# Patient Record
Sex: Female | Born: 1960
Health system: Southern US, Community
[De-identification: ages and names within clinical notes are randomized; demographics above are authoritative.]

## PROBLEM LIST (undated history)

## (undated) DIAGNOSIS — N6009 Solitary cyst of unspecified breast: Secondary | ICD-10-CM

## (undated) DIAGNOSIS — J45909 Unspecified asthma, uncomplicated: Secondary | ICD-10-CM

## (undated) DIAGNOSIS — B9681 Helicobacter pylori [H. pylori] as the cause of diseases classified elsewhere: Secondary | ICD-10-CM

## (undated) DIAGNOSIS — Z86010 Personal history of colonic polyps: Secondary | ICD-10-CM

## (undated) DIAGNOSIS — K5790 Diverticulosis of intestine, part unspecified, without perforation or abscess without bleeding: Secondary | ICD-10-CM

## (undated) DIAGNOSIS — K2970 Gastritis, unspecified, without bleeding: Secondary | ICD-10-CM

## (undated) DIAGNOSIS — Z832 Family history of diseases of the blood and blood-forming organs and certain disorders involving the immune mechanism: Secondary | ICD-10-CM

## (undated) DIAGNOSIS — D649 Anemia, unspecified: Secondary | ICD-10-CM

## (undated) DIAGNOSIS — T7840XA Allergy, unspecified, initial encounter: Secondary | ICD-10-CM

## (undated) DIAGNOSIS — E785 Hyperlipidemia, unspecified: Secondary | ICD-10-CM

## (undated) DIAGNOSIS — I252 Old myocardial infarction: Secondary | ICD-10-CM

## (undated) HISTORY — PX: COLONOSCOPY: SHX174

## (undated) HISTORY — DX: Allergy, unspecified, initial encounter: T78.40XA

## (undated) HISTORY — DX: Hyperlipidemia, unspecified: E78.5

## (undated) HISTORY — PX: WISDOM TOOTH EXTRACTION: SHX21

## (undated) HISTORY — DX: Solitary cyst of unspecified breast: N60.09

## (undated) HISTORY — DX: Diverticulosis of intestine, part unspecified, without perforation or abscess without bleeding: K57.90

## (undated) HISTORY — DX: Anemia, unspecified: D64.9

## (undated) HISTORY — DX: Personal history of colonic polyps: Z86.010

## (undated) HISTORY — DX: Family history of diseases of the blood and blood-forming organs and certain disorders involving the immune mechanism: Z83.2

## (undated) HISTORY — DX: Helicobacter pylori (H. pylori) as the cause of diseases classified elsewhere: B96.81

## (undated) HISTORY — DX: Gastritis, unspecified, without bleeding: K29.70

---

## 2002-09-27 DIAGNOSIS — I252 Old myocardial infarction: Secondary | ICD-10-CM

## 2002-09-27 HISTORY — DX: Old myocardial infarction: I25.2

## 2007-02-03 ENCOUNTER — Emergency Department (HOSPITAL_COMMUNITY): Admission: EM | Admit: 2007-02-03 | Discharge: 2007-02-03 | Payer: Self-pay | Admitting: Emergency Medicine

## 2009-09-27 HISTORY — PX: BACK SURGERY: SHX140

## 2012-02-17 ENCOUNTER — Ambulatory Visit: Payer: Self-pay | Admitting: Gynecology

## 2012-06-21 ENCOUNTER — Encounter (HOSPITAL_COMMUNITY): Payer: Self-pay

## 2012-06-21 ENCOUNTER — Emergency Department (HOSPITAL_COMMUNITY)
Admission: EM | Admit: 2012-06-21 | Discharge: 2012-06-21 | Disposition: A | Discharge status: Home / Self Care | Payer: Medicare Other | Attending: Emergency Medicine | Admitting: Emergency Medicine

## 2012-06-21 DIAGNOSIS — M25519 Pain in unspecified shoulder: Secondary | ICD-10-CM | POA: Diagnosis not present

## 2012-06-21 DIAGNOSIS — I252 Old myocardial infarction: Secondary | ICD-10-CM | POA: Diagnosis not present

## 2012-06-21 DIAGNOSIS — M25512 Pain in left shoulder: Secondary | ICD-10-CM

## 2012-06-21 DIAGNOSIS — Z7982 Long term (current) use of aspirin: Secondary | ICD-10-CM | POA: Diagnosis not present

## 2012-06-21 HISTORY — DX: Old myocardial infarction: I25.2

## 2012-06-21 LAB — TROPONIN I: Troponin I: <0.3 ng/mL (ref <0.30)

## 2012-06-21 MED ORDER — METHOCARBAMOL 100 MG/ML IJ SOLN
1000.0000 mg | Freq: Once | INTRAMUSCULAR | Status: AC
  Administered 2012-06-21: 1000 mg via INTRAMUSCULAR
  Filled 2012-06-21: qty 10

## 2012-06-21 MED ORDER — KETOROLAC TROMETHAMINE 30 MG/ML IJ SOLN
30.0000 mg | Freq: Once | INTRAMUSCULAR | Status: AC
  Administered 2012-06-21: 30 mg via INTRAMUSCULAR
  Filled 2012-06-21: qty 1

## 2012-06-21 MED ORDER — METHOCARBAMOL 500 MG PO TABS: 500.0000 mg | ORAL_TABLET | Freq: Two times a day (BID) | ORAL | Status: DC

## 2012-06-21 MED ORDER — TRAMADOL HCL 50 MG PO TABS: 50.0000 mg | ORAL_TABLET | Freq: Four times a day (QID) | ORAL | Status: DC | PRN

## 2012-06-21 NOTE — ED Provider Notes (Signed)
Medical screening examination/treatment/procedure(s) were performed by non-physician practitioner and as supervising physician I was immediately available for consultation/collaboration.  Tobin Chad, MD 06/21/12 1630

## 2012-06-21 NOTE — ED Notes (Signed)
Pt presents with left shoulder and neck pain that radiates down to left hand. Pain level 10 feel syncopal. Pt  Had back surgery 2012 on L5 disc.from work injury in 2010. Neck pain was intermittent but has recently become unbearable. Pt does not know how it became aggravated.

## 2012-06-21 NOTE — Progress Notes (Signed)
Prior to d/c [facility] cm spoke with pt confirmed no pcp and [employer] coverage CM reviewed with pt how to obtain in network [employer] providers within her zip code via toll free number or web site to prevent extra fees

## 2012-06-21 NOTE — ED Provider Notes (Signed)
History     CSN: [id number]  Arrival date & time 06/21/12  1048   First MD Initiated Contact with Patient 06/21/12 1115      Chief Complaint  Patient presents with  . Shoulder Pain    (Consider location/radiation/quality/duration/timing/severity/associated sxs/prior treatment) HPI Comments: Courtney Larsen 51 y.o. female   The chief complaint is: Patient presents with:   Shoulder Pain   The patient has medical history significant for:   Past Medical History:   MI, old                                         2004        Patient presents with left shoulder pain X 2 days with radiation and transmission to left hand. She rates the pain 10/10 and states that the arm often "locks up" on her. She attributes the pain chronic neck pain due to disc compression of C5-C7 and mild cervical stenosis per MRI done in Jan 2012. Patient would like a repeat MRI during today's fast track visit. Patient has taken bayer aspirin and vinegar (a blood thinner according to old country medicine) because when the pain started, she thought she might be having a stroke or MI. Of note the patient states that she was told she had an MI in 2005 per an EKG but never had a cath or cardiac follow-up. Denies fever or chills. Denies NVD or abdominal pain. Denies CP, SOB, or palpitations.       The history is provided by the patient. No language interpreter was used.    Past Medical History  Diagnosis Date  . MI, old 2004    No past surgical history on file.  No family history on file.  History  Substance Use Topics  . Smoking status: Not on file  . Smokeless tobacco: Not on file  . Alcohol Use:     OB History    Grav Para Term Preterm Abortions TAB SAB Ect Mult Living                  Review of Systems  Constitutional: Negative for fever and chills.  Respiratory: Negative for shortness of breath.   Cardiovascular: Negative for chest pain and palpitations.  Gastrointestinal: Negative for  nausea, vomiting, abdominal pain and diarrhea.  Musculoskeletal: Positive for arthralgias.  All other systems reviewed and are negative.    Allergies  Review of patient's allergies indicates no known allergies.  Home Medications   Current Outpatient Rx  Name Route Sig Dispense Refill  . ASPIRIN 325 MG PO TABS Oral Take 650 mg by mouth as needed. For pain    . NAPROXEN SODIUM 220 MG PO TABS Oral Take 440 mg by mouth 2 (two) times daily as needed. For pain      BP 126/56  Pulse 87  Temp 98.7 F (37.1 C) (Oral)  Resp 18  SpO2 98%  Physical Exam  Nursing note and vitals reviewed. Constitutional: She appears well-developed and well-nourished. She appears distressed.  HENT:  Head: Normocephalic and atraumatic.  Mouth/Throat: Oropharynx is clear and moist.  Eyes: Conjunctivae normal and EOM are normal. No scleral icterus.  Neck: Normal range of motion. Neck supple.  Cardiovascular: Normal rate, regular rhythm and normal heart sounds.   Pulmonary/Chest: Effort normal and breath sounds normal.  Abdominal: Soft. Bowel sounds are normal. There is no tenderness.  Musculoskeletal: Normal  range of motion. She exhibits tenderness. She exhibits no edema.       Patient has tenderness to palpation of the upper back and left shoulder. She has pain with abduction, internal, and external rotation  Radial pulse strong with good cap refill.  Neurological: She is alert.  Skin: Skin is warm and dry.    ED Course  Procedures (including critical care time)   Labs Reviewed  TROPONIN I   Results for orders placed during the hospital encounter of 06/21/12  TROPONIN I      Component Value Range   Troponin I <0.30  <0.30 ng/mL   Date: 06/21/2012  Rate: 77  Rhythm: normal sinus rhythm  QRS Axis: normal  Intervals: normal  ST/T Wave abnormalities: normal  Conduction Disutrbances:none  Narrative Interpretation: No STEMI  Old EKG Reviewed: none available    No results found.   1.  Left shoulder pain       MDM  Patient presented with left shoulder pain X 2 days that she attributes to degenerative changes in her cervical spine. Patient wanted an MRI of her cervical spine to compare to the one from Jan 2012. Patient was upset and argumentative when I informed her that per Dr. Lorenso Courier and my opinion that the MRI should be done outpatient. Given robaxin and Toradol  IM as she declined narcotics or medication in pill form, with mild improvement. Patient discharged on ultram and robaxin with referral to ortho/spine with return precautions. Given patient's self reported MI per old EKG, troponin and EKG ordered, both unremarkable. No red flags for AMI, CVA, or palsy.        Pixie Casino, PA-C 06/21/12 1520

## 2012-06-26 ENCOUNTER — Other Ambulatory Visit (HOSPITAL_COMMUNITY)
Admission: RE | Admit: 2012-06-26 | Discharge: 2012-06-26 | Disposition: A | Discharge status: Home / Self Care | Payer: Medicare Other | Source: Ambulatory Visit | Attending: Gynecology | Admitting: Gynecology

## 2012-06-26 ENCOUNTER — Encounter: Payer: Self-pay | Admitting: Gynecology

## 2012-06-26 ENCOUNTER — Ambulatory Visit (INDEPENDENT_AMBULATORY_CARE_PROVIDER_SITE_OTHER): Payer: Medicare Other | Admitting: Gynecology

## 2012-06-26 VITALS — BP 122/80 | Ht 69.25 in | Wt 185.0 lb

## 2012-06-26 DIAGNOSIS — N644 Mastodynia: Secondary | ICD-10-CM | POA: Insufficient documentation

## 2012-06-26 DIAGNOSIS — Z01419 Encounter for gynecological examination (general) (routine) without abnormal findings: Secondary | ICD-10-CM | POA: Diagnosis not present

## 2012-06-26 DIAGNOSIS — Z832 Family history of diseases of the blood and blood-forming organs and certain disorders involving the immune mechanism: Secondary | ICD-10-CM

## 2012-06-26 DIAGNOSIS — R635 Abnormal weight gain: Secondary | ICD-10-CM

## 2012-06-26 DIAGNOSIS — Z9189 Other specified personal risk factors, not elsewhere classified: Secondary | ICD-10-CM

## 2012-06-26 DIAGNOSIS — N9489 Other specified conditions associated with female genital organs and menstrual cycle: Secondary | ICD-10-CM | POA: Diagnosis not present

## 2012-06-26 DIAGNOSIS — M542 Cervicalgia: Secondary | ICD-10-CM | POA: Diagnosis not present

## 2012-06-26 DIAGNOSIS — N898 Other specified noninflammatory disorders of vagina: Secondary | ICD-10-CM

## 2012-06-26 DIAGNOSIS — N840 Polyp of corpus uteri: Secondary | ICD-10-CM | POA: Insufficient documentation

## 2012-06-26 DIAGNOSIS — N83201 Unspecified ovarian cyst, right side: Secondary | ICD-10-CM | POA: Insufficient documentation

## 2012-06-26 DIAGNOSIS — Z833 Family history of diabetes mellitus: Secondary | ICD-10-CM | POA: Insufficient documentation

## 2012-06-26 DIAGNOSIS — E782 Mixed hyperlipidemia: Secondary | ICD-10-CM | POA: Diagnosis not present

## 2012-06-26 DIAGNOSIS — Z803 Family history of malignant neoplasm of breast: Secondary | ICD-10-CM

## 2012-06-26 DIAGNOSIS — N6009 Solitary cyst of unspecified breast: Secondary | ICD-10-CM | POA: Insufficient documentation

## 2012-06-26 DIAGNOSIS — Z23 Encounter for immunization: Secondary | ICD-10-CM | POA: Diagnosis not present

## 2012-06-26 DIAGNOSIS — M25519 Pain in unspecified shoulder: Secondary | ICD-10-CM | POA: Diagnosis not present

## 2012-06-26 DIAGNOSIS — Z1151 Encounter for screening for human papillomavirus (HPV): Secondary | ICD-10-CM | POA: Insufficient documentation

## 2012-06-26 HISTORY — DX: Solitary cyst of unspecified breast: N60.09

## 2012-06-26 LAB — CBC WITH DIFFERENTIAL/PLATELET
Basophils Absolute: 0 10*3/uL (ref 0.0–0.1)
Basophils Relative: 1 % (ref 0–1)
Eosinophils Absolute: 0.5 10*3/uL (ref 0.0–0.7)
Eosinophils Relative: 12 % — ABNORMAL HIGH (ref 0–5)
HCT: 38.1 % (ref 36.0–46.0)
Hemoglobin: 12.4 g/dL (ref 12.0–15.0)
Lymphocytes Relative: 37 % (ref 12–46)
Lymphs Abs: 1.6 10*3/uL (ref 0.7–4.0)
MCH: 28.7 pg (ref 26.0–34.0)
MCHC: 32.5 g/dL (ref 30.0–36.0)
MCV: 88.2 fL (ref 78.0–100.0)
Monocytes Absolute: 0.3 10*3/uL (ref 0.1–1.0)
Monocytes Relative: 8 % (ref 3–12)
Neutro Abs: 1.8 10*3/uL (ref 1.7–7.7)
Neutrophils Relative %: 42 % — ABNORMAL LOW (ref 43–77)
Platelets: 251 10*3/uL (ref 150–400)
RBC: 4.32 MIL/uL (ref 3.87–5.11)
RDW: 13.9 % (ref 11.5–15.5)
WBC: 4.3 10*3/uL (ref 4.0–10.5)

## 2012-06-26 NOTE — Progress Notes (Addendum)
Courtney Larsen 1961-01-19 [id number]   History:    51 y.o.  for annual gyn exam gravida 3 para 3 new patient to the practice who moved into the area from [place] a few months ago. Patient with a past medical history as follows: #1 Patient with breast cancer history in her sister in her late 25s. Patient herself had a left breast cyst in 2011 that was followed with ultrasound and recommended followup but she moved out of town. He was described as being located on the left breast at the 3:00 position 7 cm from the areolar region. Patient previously screened for the BRCA one BRCA2 gene mutation and not detected. #2 Patient with history of right ovarian cyst in March of 2012 an incidental finding on ultrasound demonstrated an endometrial polyp. #3 family history of von Willebrand's disease (sister, mother and patient)  Patient has been menopausal for the past 3 years and has never been on hormone replacement therapy and denies any symptoms or any vaginal bleeding. Patient denies any past history of abnormal Paps. Patient is currently sexually active and does not recall when she's had a dTap Vaccine. Patient with no prior colonoscopy. Mother with history of diabetes.  Past medical history,surgical history, family history and social history were all reviewed and documented in the EPIC chart.  Gynecologic History Patient's last menstrual period was 06/26/2010. Contraception: none and Postmenopausal Last Pap: 2011. Results were: normal Last mammogram: 2011. Results were: See above  Obstetric History OB History    Grav Para Term Preterm Abortions TAB SAB Ect Mult Living   3 3 2 1      3      # Outc Date GA Lbr Len/2nd Wgt Sex Del Anes PTL Lv   1 PRE     M CS  Yes Yes   2 TRM     M SVD  No Yes   3 TRM     M VBAC, Forcep  No Yes       ROS: A ROS was performed and pertinent positives and negatives are included in the history.  GENERAL: No fevers or chills. HEENT: No change in vision, no earache,  sore throat or sinus congestion. NECK: No pain or stiffness. CARDIOVASCULAR: No chest pain or pressure. No palpitations. PULMONARY: No shortness of breath, cough or wheeze. GASTROINTESTINAL: No abdominal pain, nausea, vomiting or diarrhea, melena or bright red blood per rectum. GENITOURINARY: No urinary frequency, urgency, hesitancy or dysuria. MUSCULOSKELETAL: No joint or muscle pain, no back pain, no recent trauma. DERMATOLOGIC: No rash, no itching, no lesions. ENDOCRINE: No polyuria, polydipsia, no heat or cold intolerance. No recent change in weight. HEMATOLOGICAL: No anemia or easy bruising or bleeding. NEUROLOGIC: No headache, seizures, numbness, tingling or weakness. PSYCHIATRIC: No depression, no loss of interest in normal activity or change in sleep pattern.     Exam: chaperone present  BP 122/80  Ht 5' 9.25" (1.759 m)  Wt 185 lb (83.915 kg)  BMI 27.12 kg/m2  LMP 06/26/2010  Body mass index is 27.12 kg/(m^2).  General appearance : Well developed well nourished female. No acute distress HEENT: Neck supple, trachea midline, no carotid bruits, no thyroidmegaly Lungs: Clear to auscultation, no rhonchi or wheezes, or rib retractions  Heart: Regular rate and rhythm, no murmurs or gallops Breast:Examined in sitting and supine position were symmetrical in appearance, no palpable masses some tenderness was noted in the  outer quadrant of both breasts, no skin retraction, no nipple inversion, no nipple discharge, no skin  discoloration, no axillary or supraclavicular lymphadenopathy Abdomen: no palpable masses or tenderness, no rebound or guarding Extremities: no edema or skin discoloration or tenderness  Pelvic:  Bartholin, Urethra, Skene Glands: Within normal limits             Vagina: No gross lesions or discharge  Cervix: No gross lesions or discharge  Uterus  anteverted, normal size, shape and consistency, non-tender and mobile  Adnexa  Without masses or tenderness  Anus and perineum   normal   Rectovaginal  normal sphincter tone without palpated masses or tenderness             Hemoccult cards will be provided her to submit to the office for testing     Assessment/Plan:  51 y.o. female for annual exam with past history of left breast cyst in the [place]. No prior breast biopsy. The left breast cyst was described on the radiology report at the left breast 3:00 position 7 cm from the areolar region. Patient does have bilateral mastodynia. She will be referred for a diagnostic mammogram of the left breast and screening mammogram on the right. New Pap smear screening guidelines discussed. Since patient has no past history of abnormal Pap smears we'll start next year with a new guidelines. Since we do not have any reports from prior years and since she moved from out of town we'll do a Pap smear today. She was given the names and numbers of gastroenterologist in our community for her to schedule colonoscopy. She was instructed to submit to the office Hemoccult cards for testing. She will return back to the office next week for sonohysterogram to better assess her intrauterine cavity because of reported history of endometrial polyp in the past although she has had no vaginal bleeding. We'll also followup on the right ovarian cyst that was described on ultrasound report from March of 2012. We discussed using luvena probiotic vaginal gel as a lubricant when necessary. The following labs will be ordered today: Fasting lipid profile, fasting blood sugar, TSH, CBC, urinalysis and Pap smear. Patient was to receive the dTap Vaccine today. Patient was instructed to cut down on ice tea and sodas to help cut down her mastodynia.  Ok Edwards MD, 2:07 PM 06/26/2012

## 2012-06-26 NOTE — Patient Instructions (Addendum)
Diphtheria, Tetanus, and Pertussis (DTaP) Vaccine What You Need to Know WHY GET VACCINATED? Diphtheria, tetanus, and pertussis are serious diseases caused by bacteria. Diphtheria and pertussis are spread from person to person. Tetanus enters the body through cuts or wounds. Diphtheria causes a thick covering in the back of the throat.  It can lead to breathing problems, paralysis, heart failure, and even death.  Tetanus (Lockjaw) causes painful tightening of the muscles, usually all over the body.  It can lead to "locking" of the jaw so the victim cannot open his or her mouth or swallow. Tetanus leads to death in about 2 out of 10 cases.  Pertussis (Whooping Cough) causes coughing spells so bad that it is hard for infants to eat, drink, or breathe. These spells can last for weeks.  It can lead to pneumonia, seizures (jerking and staring spells), brain damage, and death.  Diphtheria, tetanus, and pertussis vaccine (DTaP) can help prevent these diseases. Most children who are vaccinated with DTaP will be protected throughout childhood. Many more children would get these diseases if we stopped vaccinating. DTaP is a safer version of an older vaccine called DTP. DTP is no longer used in the [place]. WHO SHOULD GET DTAP VACCINE AND WHEN? Children should get 5 doses of DTaP vaccine, 1 dose at each of the following ages:  2 months.   4 months.   6 months.   15 to 18 months.   4 to 6 years.  DTaP may be given at the same time as other vaccines. SOME CHILDREN SHOULD NOT GET DTAP VACCINE OR SHOULD WAIT  Children with minor illnesses, such as a cold, may be vaccinated. But children who are moderately or severely ill should usually wait until they recover before getting DTaP vaccine.   Any child who had a life-threatening allergic reaction after a dose of DTaP should not get another dose.   Any child who suffered a brain or nervous system disease within 7 days after a dose of DTaP should  not get another dose.   Talk with your caregiver if your child:   Had a seizure or collapsed after a dose of DTaP.   Cried non-stop for 3 hours or more after a dose of DTaP.   Had a fever over 105 F (40.6 C) after a dose of DTaP.   Ask your caregiver for more information. Some of these children should not get another dose of pertussis vaccine, but may get a vaccine without pertussis, called DT.  OLDER CHILDREN AND ADULTS  DTaP is not licensed for adolescents, adults, or children 1 years of age and older.   But older people still need protection. A vaccine called Tdap is similar to DTaP. A single dose of Tdap is recommended for people 11 through 53 years of age. Another vaccine, called Td, protects against tetanus and diphtheria, but not pertussis. It is recommended every 10 years.  WHAT ARE THE RISKS FROM DTAP VACCINE?  Getting diphtheria, tetanus, or pertussis disease is much riskier than getting DTaP vaccine.   However, a vaccine, like any medicine, is capable of causing serious problems, such as severe allergic reactions. The risk of DTaP vaccine causing serious harm, or death, is extremely small.  Mild Problems (Common)  Fever (up to about 1 child in 4).   Redness or swelling where the shot was given (up to about 1 child in 4).   Soreness or tenderness where the shot was given (up to about 1 child in 4).  These problems occur more often after the 4th and 5th doses of the DTaP series than after earlier doses. Sometimes the 4th or 5th dose of DTaP vaccine is followed by swelling of the entire arm or leg in which the shot was given, lasting 1 to 7 days (up to about 1 child in 30). Other mild problems include:  Fussiness (up to about 1 child in 3).   Tiredness or poor appetite (up to about 1 child in 10).   Vomiting (up to about 1 child in 50).  These problems generally occur 1 to 3 days after the shot. Moderate Problems (Uncommon)  Seizure (jerking or staring) (about 1 child  out of 14,000).   Non-stop crying, for 3 hours or more (up to about 1 child out of 1,000).   High fever, over 105 F (40.6 C) (about 1 child out of 16,000).  Severe Problems (Very Rare)  Serious allergic reaction (less than 1 out of a million doses).   Several other severe problems have been reported after DTaP vaccine. These include:   Long-term seizures, coma, or lowered consciousness.   Permanent brain damage.  These are so rare it is hard to tell if they are caused by the vaccine. Controlling fever is especially important for children who have had seizures, for any reason. It is also important if another family member has had seizures. You can reduce fever and pain by giving your child an aspirin-free pain reliever when the shot is given, and for the next 24 hours, following the package instructions. WHAT IF THERE IS A MODERATE OR SEVERE REACTION? What should I look for? Any unusual conditions, such as a serious allergic reaction, high fever, or unusual behavior. Serious allergic reactions are extremely rare with any vaccine. If one were to occur, it would most likely be within a few minutes to a few hours after the shot. Signs can include difficulty breathing, hoarseness or wheezing, hives, paleness, weakness, a fast heartbeat, or dizziness. If a high fever or seizure were to occur, it would usually be within a week after the shot. What should I do?  Call your caregiver or get the person to a caregiver right away.   Tell the caregiver what happened, the date and time it happened, and when the vaccination was given.   Ask the caregiver, nurse, or health department to file a Vaccine Adverse Event Reporting System (VAERS) form. Or, you can file this report through the VAERS website at www.vaers.LAgents.no or by calling 1-(563)628-9859.  VAERS does not provide medical advice. THE NATIONAL VACCINE INJURY COMPENSATION PROGRAM  In the rare event that you or your child has a serious reaction  to a vaccine, a federal program has been created to help you pay for the care of those who have been harmed.   For details about the National Vaccine Injury Compensation Program, call 315-830-0253 or visit the program's website at SpiritualWord.at  HOW CAN I LEARN MORE?  Ask your caregiver. They can give you the vaccine package insert or suggest other sources of information.   Call your local or state health department's immunization program.   Contact the Centers for Disease Control and Prevention (CDC):   Call 778-515-9892 (1-800-CDC-INFO).   Visit the [employer] at PicCapture.uy  CDC Diphtheria, Tetanus, and Pertussis (DTaP) Vaccine VIS (02/10/06) Document Released: 07/11/2006 Document Revised: 09/02/2011 Document Reviewed: 07/11/2006 [facility] Patient Information 2012 [place], [place].

## 2012-06-27 LAB — GLUCOSE, RANDOM: Glucose, Bld: 79 mg/dL (ref 70–99)

## 2012-06-27 LAB — URINALYSIS W MICROSCOPIC + REFLEX CULTURE
Bacteria, UA: NONE SEEN
Bilirubin Urine: NEGATIVE
Casts: NONE SEEN
Crystals: NONE SEEN
Glucose, UA: NEGATIVE mg/dL
Hgb urine dipstick: NEGATIVE
Ketones, ur: NEGATIVE mg/dL
Leukocytes, UA: NEGATIVE
Nitrite: NEGATIVE
Protein, ur: NEGATIVE mg/dL
Specific Gravity, Urine: 1.024 (ref 1.005–1.030)
Urobilinogen, UA: 0.2 mg/dL (ref 0.0–1.0)
pH: 7.5 (ref 5.0–8.0)

## 2012-06-27 LAB — LIPID PANEL
Cholesterol: 227 mg/dL — ABNORMAL HIGH (ref 0–200)
HDL: 57 mg/dL (ref >39)
LDL Cholesterol: 157 mg/dL — ABNORMAL HIGH (ref 0–99)
Total CHOL/HDL Ratio: 4 Ratio
Triglycerides: 67 mg/dL (ref <150)
VLDL: 13 mg/dL (ref 0–40)

## 2012-06-27 LAB — TSH: TSH: 1.045 u[IU]/mL (ref 0.350–4.500)

## 2012-06-28 ENCOUNTER — Other Ambulatory Visit: Payer: Self-pay | Admitting: Orthopedic Surgery

## 2012-06-28 DIAGNOSIS — M25512 Pain in left shoulder: Secondary | ICD-10-CM

## 2012-06-28 DIAGNOSIS — M542 Cervicalgia: Secondary | ICD-10-CM

## 2012-06-30 ENCOUNTER — Other Ambulatory Visit: Payer: Self-pay | Admitting: Gynecology

## 2012-06-30 DIAGNOSIS — E7800 Pure hypercholesterolemia, unspecified: Secondary | ICD-10-CM

## 2012-07-02 ENCOUNTER — Ambulatory Visit
Admission: RE | Admit: 2012-07-02 | Discharge: 2012-07-02 | Disposition: A | Discharge status: Home / Self Care | Payer: Medicare Other | Source: Ambulatory Visit | Attending: Orthopedic Surgery | Admitting: Orthopedic Surgery

## 2012-07-02 DIAGNOSIS — M67919 Unspecified disorder of synovium and tendon, unspecified shoulder: Secondary | ICD-10-CM | POA: Diagnosis not present

## 2012-07-02 DIAGNOSIS — M25512 Pain in left shoulder: Secondary | ICD-10-CM

## 2012-07-02 DIAGNOSIS — M542 Cervicalgia: Secondary | ICD-10-CM

## 2012-07-02 DIAGNOSIS — M47812 Spondylosis without myelopathy or radiculopathy, cervical region: Secondary | ICD-10-CM | POA: Diagnosis not present

## 2012-07-02 DIAGNOSIS — M25519 Pain in unspecified shoulder: Secondary | ICD-10-CM | POA: Diagnosis not present

## 2012-07-03 ENCOUNTER — Other Ambulatory Visit: Payer: Self-pay | Admitting: Gynecology

## 2012-07-03 ENCOUNTER — Telehealth: Payer: Self-pay | Admitting: *Deleted

## 2012-07-03 ENCOUNTER — Ambulatory Visit (INDEPENDENT_AMBULATORY_CARE_PROVIDER_SITE_OTHER): Payer: Medicare Other | Admitting: Gynecology

## 2012-07-03 ENCOUNTER — Ambulatory Visit (INDEPENDENT_AMBULATORY_CARE_PROVIDER_SITE_OTHER): Payer: Medicare Other

## 2012-07-03 DIAGNOSIS — N840 Polyp of corpus uteri: Secondary | ICD-10-CM

## 2012-07-03 DIAGNOSIS — N83201 Unspecified ovarian cyst, right side: Secondary | ICD-10-CM

## 2012-07-03 DIAGNOSIS — N83209 Unspecified ovarian cyst, unspecified side: Secondary | ICD-10-CM | POA: Diagnosis not present

## 2012-07-03 DIAGNOSIS — Z8742 Personal history of other diseases of the female genital tract: Secondary | ICD-10-CM

## 2012-07-03 DIAGNOSIS — N949 Unspecified condition associated with female genital organs and menstrual cycle: Secondary | ICD-10-CM

## 2012-07-03 DIAGNOSIS — Z803 Family history of malignant neoplasm of breast: Secondary | ICD-10-CM

## 2012-07-03 MED ORDER — LIDOCAINE HCL 1 % IJ SOLN
5.0000 mL | Freq: Once | INTRAMUSCULAR | Status: AC
  Administered 2012-07-03: 5 mL

## 2012-07-03 NOTE — Progress Notes (Signed)
51 y.o. who was seen in the office on October 1 for annual gyn exam gravida 3 para 3 new patient to the practice who moved into the area from [place] a few months ago. Patient with a past medical history as follows:   Patient with breast cancer history in her sister in her late 53s. Patient herself had a left breast cyst in 2011 that was followed with ultrasound and recommended followup but she moved out of town. He was described as being located on the left breast at the 3:00 position 7 cm from the areolar region. Patient previously screened for the BRCA one BRCA2 gene mutation and not detected.   Patient with history of right ovarian cyst in March of 2012 an incidental finding on ultrasound demonstrated an endometrial polyp.   family history of von Willebrand's disease (sister, mother and patient)  Patient has been menopausal for the past 3 years and has never been on hormone replacement therapy and denies any symptoms or any vaginal bleeding.  Patient denies any past history of abnormal Paps.  Patient denies any vaginal bleeding and presented to the office for followup ultrasound. Ultrasound report as follows: Uterus measures 7.5 x 4.5 x 3.7 cm with endometrial stripe of 3.8 mm. Right and left ovary otherwise normal. Previously reported ovarian cyst not seen on the right. Sonohysterogram no intracavitary defect and left ovary was normal.  Patient will followup as instructed with her gastroenterologist for screening colonoscopy. She will be referred to the radiology service for diagnostic mammogram of the left breast due to the report in [place] stating that at the left breast 3:00 position 7 cm from the areolar region there was a breast cyst. Patient's recent labs indicated her total cholesterol was slightly elevated at 227 and LDL elevated at 157. The remainder of her lipid profile was otherwise normal. She will be placed on a low-fat diet and encouraged to exercise 3-4 times a week and we  can repeat her lipid profile in 6 months. Her CBC, TSH, urinalysis and Pap smear were normal.

## 2012-07-03 NOTE — Telephone Encounter (Signed)
Order placed

## 2012-07-03 NOTE — Telephone Encounter (Signed)
Message copied by Aura Camps on Mon Jul 03, 2012  2:04 PM ------      Message from: Ok Edwards      Created: Mon Jul 03, 2012 12:59 PM       [name], this patient needs a diagnostic mammogram of left breast and screening mammogram of the right. Patient expecting call to schedule appointment.            Patient with breast cancer history in her sister in her late 57s. Patient herself had a left breast cyst in 2011 that was followed with ultrasound and recommended followup but she moved out of town. He was described as being located on the left breast at the 3:00 position 7 cm from the areolar region. Patient previously screened for the BRCA one BRCA2 gene mutation and not detected.            Thank you

## 2012-07-04 DIAGNOSIS — M542 Cervicalgia: Secondary | ICD-10-CM | POA: Diagnosis not present

## 2012-07-06 ENCOUNTER — Encounter (HOSPITAL_COMMUNITY): Payer: Self-pay | Admitting: Anesthesiology

## 2012-07-06 ENCOUNTER — Inpatient Hospital Stay (HOSPITAL_COMMUNITY): Payer: Medicare Other | Admitting: Anesthesiology

## 2012-07-06 ENCOUNTER — Other Ambulatory Visit: Payer: Self-pay | Admitting: Neurosurgery

## 2012-07-06 ENCOUNTER — Inpatient Hospital Stay (HOSPITAL_COMMUNITY)
Admission: AD | Admit: 2012-07-06 | Discharge: 2012-07-07 | DRG: 473 | Disposition: A | Discharge status: Home / Self Care | Payer: Medicare Other | Source: Ambulatory Visit | Attending: Neurosurgery | Admitting: Neurosurgery

## 2012-07-06 ENCOUNTER — Encounter (HOSPITAL_COMMUNITY)
Admission: AD | Disposition: A | Discharge status: Home / Self Care | Payer: Self-pay | Source: Ambulatory Visit | Attending: Neurosurgery

## 2012-07-06 ENCOUNTER — Encounter (HOSPITAL_COMMUNITY): Payer: Self-pay | Admitting: *Deleted

## 2012-07-06 ENCOUNTER — Inpatient Hospital Stay (HOSPITAL_COMMUNITY): Payer: Medicare Other

## 2012-07-06 DIAGNOSIS — Z23 Encounter for immunization: Secondary | ICD-10-CM | POA: Diagnosis not present

## 2012-07-06 DIAGNOSIS — M5090 Cervical disc disorder, unspecified, unspecified cervical region: Secondary | ICD-10-CM | POA: Diagnosis not present

## 2012-07-06 DIAGNOSIS — M1990 Unspecified osteoarthritis, unspecified site: Secondary | ICD-10-CM | POA: Diagnosis present

## 2012-07-06 DIAGNOSIS — I252 Old myocardial infarction: Secondary | ICD-10-CM

## 2012-07-06 DIAGNOSIS — M5020 Other cervical disc displacement, unspecified cervical region: Secondary | ICD-10-CM | POA: Diagnosis not present

## 2012-07-06 DIAGNOSIS — M5412 Radiculopathy, cervical region: Secondary | ICD-10-CM | POA: Diagnosis not present

## 2012-07-06 DIAGNOSIS — R29898 Other symptoms and signs involving the musculoskeletal system: Secondary | ICD-10-CM | POA: Diagnosis not present

## 2012-07-06 HISTORY — PX: ANTERIOR CERVICAL DECOMP/DISCECTOMY FUSION: SHX1161

## 2012-07-06 HISTORY — DX: Unspecified asthma, uncomplicated: J45.909

## 2012-07-06 LAB — BASIC METABOLIC PANEL WITH GFR
BUN: 11 mg/dL (ref 6–23)
CO2: 25 meq/L (ref 19–32)
Calcium: 9.5 mg/dL (ref 8.4–10.5)
Chloride: 102 meq/L (ref 96–112)
Creatinine, Ser: 0.62 mg/dL (ref 0.50–1.10)
GFR calc Af Amer: >90 mL/min
GFR calc non Af Amer: >90 mL/min
Glucose, Bld: 91 mg/dL (ref 70–99)
Potassium: 4.7 meq/L (ref 3.5–5.1)
Sodium: 139 meq/L (ref 135–145)

## 2012-07-06 LAB — CBC
HCT: 37 % (ref 36.0–46.0)
Hemoglobin: 12.2 g/dL (ref 12.0–15.0)
MCH: 29.2 pg (ref 26.0–34.0)
MCHC: 33 g/dL (ref 30.0–36.0)
MCV: 88.5 fL (ref 78.0–100.0)
Platelets: 224 10*3/uL (ref 150–400)
RBC: 4.18 MIL/uL (ref 3.87–5.11)
RDW: 14 % (ref 11.5–15.5)
WBC: 5.1 10*3/uL (ref 4.0–10.5)

## 2012-07-06 LAB — SURGICAL PCR SCREEN
MRSA, PCR: NEGATIVE
Staphylococcus aureus: NEGATIVE

## 2012-07-06 SURGERY — ANTERIOR CERVICAL DECOMPRESSION/DISCECTOMY FUSION 1 LEVEL
Anesthesia: General | Site: Spine Cervical | Wound class: Clean

## 2012-07-06 MED ORDER — CEFAZOLIN SODIUM-DEXTROSE 2-3 GM-% IV SOLR
2.0000 g | INTRAVENOUS | Status: AC
  Administered 2012-07-06: 2 g via INTRAVENOUS

## 2012-07-06 MED ORDER — FENTANYL CITRATE 0.05 MG/ML IJ SOLN
INTRAMUSCULAR | Status: DC | PRN
  Administered 2012-07-06: 150 ug via INTRAVENOUS
  Administered 2012-07-06: 100 ug via INTRAVENOUS

## 2012-07-06 MED ORDER — MENTHOL 3 MG MT LOZG: 1.0000 | LOZENGE | OROMUCOSAL | Status: DC | PRN

## 2012-07-06 MED ORDER — SODIUM CHLORIDE 0.9 % IV SOLN: 250.0000 mL | INTRAVENOUS | Status: DC

## 2012-07-06 MED ORDER — DEXAMETHASONE SODIUM PHOSPHATE 4 MG/ML IJ SOLN
INTRAMUSCULAR | Status: DC | PRN
  Administered 2012-07-06: 8 mg via INTRAVENOUS

## 2012-07-06 MED ORDER — PHENOL 1.4 % MT LIQD: 1.0000 | OROMUCOSAL | Status: DC | PRN

## 2012-07-06 MED ORDER — OXYCODONE HCL 5 MG PO TABS: 5.0000 mg | ORAL_TABLET | Freq: Once | ORAL | Status: DC | PRN

## 2012-07-06 MED ORDER — CEFAZOLIN SODIUM-DEXTROSE 2-3 GM-% IV SOLR
INTRAVENOUS | Status: AC
  Filled 2012-07-06: qty 50

## 2012-07-06 MED ORDER — SODIUM CHLORIDE 0.9 % IJ SOLN: 3.0000 mL | INTRAMUSCULAR | Status: DC | PRN

## 2012-07-06 MED ORDER — 0.9 % SODIUM CHLORIDE (POUR BTL) OPTIME
TOPICAL | Status: DC | PRN
  Administered 2012-07-06: 1000 mL

## 2012-07-06 MED ORDER — HYDROMORPHONE HCL PF 1 MG/ML IJ SOLN
INTRAMUSCULAR | Status: AC
  Filled 2012-07-06: qty 1

## 2012-07-06 MED ORDER — HYDROMORPHONE HCL PF 1 MG/ML IJ SOLN
0.2500 mg | INTRAMUSCULAR | Status: DC | PRN
  Administered 2012-07-06: 0.5 mg via INTRAVENOUS

## 2012-07-06 MED ORDER — HYDROCODONE-ACETAMINOPHEN 5-325 MG PO TABS: 1.0000 | ORAL_TABLET | ORAL | Status: DC | PRN

## 2012-07-06 MED ORDER — ACETAMINOPHEN 325 MG PO TABS: 650.0000 mg | ORAL_TABLET | ORAL | Status: DC | PRN

## 2012-07-06 MED ORDER — MORPHINE SULFATE 2 MG/ML IJ SOLN: 1.0000 mg | INTRAMUSCULAR | Status: DC | PRN

## 2012-07-06 MED ORDER — ONDANSETRON HCL 4 MG/2ML IJ SOLN
INTRAMUSCULAR | Status: DC | PRN
  Administered 2012-07-06: 4 mg via INTRAVENOUS

## 2012-07-06 MED ORDER — ONDANSETRON HCL 4 MG/2ML IJ SOLN: 4.0000 mg | Freq: Once | INTRAMUSCULAR | Status: DC | PRN

## 2012-07-06 MED ORDER — INFLUENZA VIRUS VACC SPLIT PF IM SUSP
0.5000 mL | INTRAMUSCULAR | Status: DC
  Filled 2012-07-06: qty 0.5

## 2012-07-06 MED ORDER — MIDAZOLAM HCL 5 MG/5ML IJ SOLN
INTRAMUSCULAR | Status: DC | PRN
  Administered 2012-07-06: 2 mg via INTRAVENOUS

## 2012-07-06 MED ORDER — ACETAMINOPHEN 650 MG RE SUPP: 650.0000 mg | RECTAL | Status: DC | PRN

## 2012-07-06 MED ORDER — ROCURONIUM BROMIDE 100 MG/10ML IV SOLN
INTRAVENOUS | Status: DC | PRN
  Administered 2012-07-06: 50 mg via INTRAVENOUS

## 2012-07-06 MED ORDER — PNEUMOCOCCAL VAC POLYVALENT 25 MCG/0.5ML IJ INJ
0.5000 mL | INJECTION | INTRAMUSCULAR | Status: AC
  Administered 2012-07-07: 0.5 mL via INTRAMUSCULAR
  Filled 2012-07-06: qty 0.5

## 2012-07-06 MED ORDER — GLYCOPYRROLATE 0.2 MG/ML IJ SOLN
INTRAMUSCULAR | Status: DC | PRN
  Administered 2012-07-06: 0.4 mg via INTRAVENOUS

## 2012-07-06 MED ORDER — LACTATED RINGERS IV SOLN
INTRAVENOUS | Status: DC | PRN
  Administered 2012-07-06 (×2): via INTRAVENOUS

## 2012-07-06 MED ORDER — ONDANSETRON HCL 4 MG/2ML IJ SOLN: 4.0000 mg | INTRAMUSCULAR | Status: DC | PRN

## 2012-07-06 MED ORDER — POTASSIUM CHLORIDE IN NACL 20-0.9 MEQ/L-% IV SOLN
INTRAVENOUS | Status: DC
  Administered 2012-07-07: via INTRAVENOUS
  Filled 2012-07-06 (×2): qty 1000

## 2012-07-06 MED ORDER — SODIUM CHLORIDE 0.9 % IJ SOLN: 3.0000 mL | Freq: Two times a day (BID) | INTRAMUSCULAR | Status: DC

## 2012-07-06 MED ORDER — MEPERIDINE HCL 25 MG/ML IJ SOLN: 6.2500 mg | INTRAMUSCULAR | Status: DC | PRN

## 2012-07-06 MED ORDER — OXYCODONE-ACETAMINOPHEN 5-325 MG PO TABS: 1.0000 | ORAL_TABLET | ORAL | Status: DC | PRN

## 2012-07-06 MED ORDER — NEOSTIGMINE METHYLSULFATE 1 MG/ML IJ SOLN
INTRAMUSCULAR | Status: DC | PRN
  Administered 2012-07-06: 3 mg via INTRAVENOUS

## 2012-07-06 MED ORDER — OXYCODONE HCL 5 MG/5ML PO SOLN: 5.0000 mg | Freq: Once | ORAL | Status: DC | PRN

## 2012-07-06 MED ORDER — HEMOSTATIC AGENTS (NO CHARGE) OPTIME
TOPICAL | Status: DC | PRN
  Administered 2012-07-06: 1 via TOPICAL

## 2012-07-06 MED ORDER — LIDOCAINE HCL (CARDIAC) 20 MG/ML IV SOLN
INTRAVENOUS | Status: DC | PRN
  Administered 2012-07-06: 100 mg via INTRAVENOUS

## 2012-07-06 MED ORDER — LIDOCAINE-EPINEPHRINE 0.5 %-1:200000 IJ SOLN
INTRAMUSCULAR | Status: DC | PRN
  Administered 2012-07-06: 1 mL

## 2012-07-06 MED ORDER — PROPOFOL 10 MG/ML IV BOLUS
INTRAVENOUS | Status: DC | PRN
  Administered 2012-07-06: 120 mg via INTRAVENOUS

## 2012-07-06 MED ORDER — THROMBIN 5000 UNITS EX KIT
PACK | CUTANEOUS | Status: DC | PRN
  Administered 2012-07-06 (×2): 5000 [IU] via TOPICAL

## 2012-07-06 MED ORDER — ACETAMINOPHEN 10 MG/ML IV SOLN
1000.0000 mg | Freq: Four times a day (QID) | INTRAVENOUS | Status: DC
  Administered 2012-07-07 (×2): 1000 mg via INTRAVENOUS
  Filled 2012-07-06 (×4): qty 100

## 2012-07-06 MED ORDER — MUPIROCIN 2 % EX OINT
TOPICAL_OINTMENT | Freq: Two times a day (BID) | CUTANEOUS | Status: DC
  Administered 2012-07-06: 1 via NASAL
  Filled 2012-07-06: qty 22

## 2012-07-06 MED ORDER — MUPIROCIN 2 % EX OINT
TOPICAL_OINTMENT | CUTANEOUS | Status: AC
  Filled 2012-07-06: qty 22

## 2012-07-06 SURGICAL SUPPLY — 73 items
ADH SKN CLS APL DERMABOND .7 (GAUZE/BANDAGES/DRESSINGS) ×1
BANDAGE GAUZE ELAST BULKY 4 IN (GAUZE/BANDAGES/DRESSINGS) ×2 IMPLANT
BIT DRILL 14MM (INSTRUMENTS) IMPLANT
BIT DRILL NEURO 2X3.1 SFT TUCH (MISCELLANEOUS) ×1 IMPLANT
BLADE SURG ROTATE 9660 (MISCELLANEOUS) IMPLANT
BUR DRUM 4.0 (BURR) ×1 IMPLANT
CANISTER SUCTION 2500CC (MISCELLANEOUS) ×2 IMPLANT
CLOTH BEACON ORANGE TIMEOUT ST (SAFETY) ×2 IMPLANT
CONT SPEC 4OZ CLIKSEAL STRL BL (MISCELLANEOUS) ×2 IMPLANT
DECANTER SPIKE VIAL GLASS SM (MISCELLANEOUS) ×2 IMPLANT
DERMABOND ADVANCED (GAUZE/BANDAGES/DRESSINGS) ×1
DERMABOND ADVANCED .7 DNX12 (GAUZE/BANDAGES/DRESSINGS) ×1 IMPLANT
DRAPE LAPAROTOMY 100X72 PEDS (DRAPES) ×2 IMPLANT
DRAPE MICROSCOPE LEICA (MISCELLANEOUS) ×2 IMPLANT
DRAPE POUCH INSTRU U-SHP 10X18 (DRAPES) ×2 IMPLANT
DRAPE PROXIMA HALF (DRAPES) ×2 IMPLANT
DRILL 14MM (INSTRUMENTS) ×2
DRILL NEURO 2X3.1 SOFT TOUCH (MISCELLANEOUS) ×2
DURAPREP 6ML APPLICATOR 50/CS (WOUND CARE) ×2 IMPLANT
ELECT COATED BLADE 2.86 ST (ELECTRODE) ×2 IMPLANT
ELECT REM PT RETURN 9FT ADLT (ELECTROSURGICAL) ×2
ELECTRODE REM PT RTRN 9FT ADLT (ELECTROSURGICAL) ×1 IMPLANT
GAUZE SPONGE 4X4 16PLY XRAY LF (GAUZE/BANDAGES/DRESSINGS) IMPLANT
GLOVE BIO SURGEON STRL SZ 6.5 (GLOVE) IMPLANT
GLOVE BIO SURGEON STRL SZ7 (GLOVE) IMPLANT
GLOVE BIO SURGEON STRL SZ7.5 (GLOVE) IMPLANT
GLOVE BIO SURGEON STRL SZ8 (GLOVE) IMPLANT
GLOVE BIO SURGEON STRL SZ8.5 (GLOVE) IMPLANT
GLOVE BIOGEL M 8.0 STRL (GLOVE) IMPLANT
GLOVE BIOGEL PI IND STRL 7.5 (GLOVE) IMPLANT
GLOVE BIOGEL PI INDICATOR 7.5 (GLOVE) ×1
GLOVE ECLIPSE 6.5 STRL STRAW (GLOVE) ×2 IMPLANT
GLOVE ECLIPSE 7.0 STRL STRAW (GLOVE) ×3 IMPLANT
GLOVE ECLIPSE 7.5 STRL STRAW (GLOVE) ×2 IMPLANT
GLOVE ECLIPSE 8.0 STRL XLNG CF (GLOVE) IMPLANT
GLOVE ECLIPSE 8.5 STRL (GLOVE) IMPLANT
GLOVE EXAM NITRILE LRG STRL (GLOVE) IMPLANT
GLOVE EXAM NITRILE MD LF STRL (GLOVE) IMPLANT
GLOVE EXAM NITRILE XL STR (GLOVE) IMPLANT
GLOVE EXAM NITRILE XS STR PU (GLOVE) IMPLANT
GLOVE INDICATOR 6.5 STRL GRN (GLOVE) IMPLANT
GLOVE INDICATOR 7.0 STRL GRN (GLOVE) IMPLANT
GLOVE INDICATOR 7.5 STRL GRN (GLOVE) IMPLANT
GLOVE INDICATOR 8.0 STRL GRN (GLOVE) IMPLANT
GLOVE INDICATOR 8.5 STRL (GLOVE) IMPLANT
GLOVE OPTIFIT SS 8.0 STRL (GLOVE) IMPLANT
GLOVE SURG SS PI 6.5 STRL IVOR (GLOVE) IMPLANT
GOWN BRE IMP SLV AUR LG STRL (GOWN DISPOSABLE) ×4 IMPLANT
GOWN BRE IMP SLV AUR XL STRL (GOWN DISPOSABLE) ×1 IMPLANT
GOWN STRL REIN 2XL LVL4 (GOWN DISPOSABLE) IMPLANT
KIT BASIN OR (CUSTOM PROCEDURE TRAY) ×2 IMPLANT
KIT ROOM TURNOVER OR (KITS) ×2 IMPLANT
NDL HYPO 25X1 1.5 SAFETY (NEEDLE) ×1 IMPLANT
NDL SPNL 22GX3.5 QUINCKE BK (NEEDLE) ×1 IMPLANT
NEEDLE HYPO 25X1 1.5 SAFETY (NEEDLE) ×2 IMPLANT
NEEDLE SPNL 22GX3.5 QUINCKE BK (NEEDLE) ×2 IMPLANT
NS IRRIG 1000ML POUR BTL (IV SOLUTION) ×2 IMPLANT
PACK LAMINECTOMY NEURO (CUSTOM PROCEDURE TRAY) ×2 IMPLANT
PAD ARMBOARD 7.5X6 YLW CONV (MISCELLANEOUS) ×6 IMPLANT
PIN DISTRACTION 14MM (PIN) ×4 IMPLANT
PLATE 14MM (Plate) ×1 IMPLANT
RUBBERBAND STERILE (MISCELLANEOUS) ×4 IMPLANT
SCREW 14MM (Screw) ×4 IMPLANT
SPACER CC-ACF 8MM PARALLEL (Bone Implant) ×1 IMPLANT
SPONGE INTESTINAL PEANUT (DISPOSABLE) ×2 IMPLANT
SPONGE SURGIFOAM ABS GEL SZ50 (HEMOSTASIS) ×2 IMPLANT
SUT VIC AB 0 CT1 27 (SUTURE) ×2
SUT VIC AB 0 CT1 27XBRD ANTBC (SUTURE) ×1 IMPLANT
SUT VIC AB 3-0 SH 8-18 (SUTURE) ×3 IMPLANT
SYR 20ML ECCENTRIC (SYRINGE) ×2 IMPLANT
TOWEL OR 17X24 6PK STRL BLUE (TOWEL DISPOSABLE) ×2 IMPLANT
TOWEL OR 17X26 10 PK STRL BLUE (TOWEL DISPOSABLE) ×2 IMPLANT
WATER STERILE IRR 1000ML POUR (IV SOLUTION) ×2 IMPLANT

## 2012-07-06 NOTE — Transfer of Care (Signed)
Immediate Anesthesia Transfer of Care Note  Patient: Courtney Larsen  Procedure(s) Performed: Procedure(s) (LRB) with comments: ANTERIOR CERVICAL DECOMPRESSION/DISCECTOMY FUSION 1 LEVEL (N/A) - Cervical six-seven anterior cervical decompression with fusion plating and bonegraft  Patient Location: PACU  Anesthesia Type: General  Level of Consciousness: sedated  Airway & Oxygen Therapy: Patient Spontanous Breathing and Patient connected to nasal cannula oxygen  Post-op Assessment: Report given to PACU RN and Post -op Vital signs reviewed and stable  Post vital signs: Reviewed and stable  Complications: No apparent anesthesia complications

## 2012-07-06 NOTE — Anesthesia Preprocedure Evaluation (Addendum)
Anesthesia Evaluation  Patient identified by MRN, date of birth, ID band Patient awake    Reviewed: Allergy & Precautions, H&P , NPO status , Patient's Chart, lab work & pertinent test results  Airway Mallampati: I TM Distance: >3 FB Neck ROM: Full    Dental  (+) Dental Advisory Given   Pulmonary asthma ,   A bit of sinus congestion Pulmonary exam normal       Cardiovascular Exercise Tolerance: Good + Past MI Rhythm:Regular Rate:Normal     Neuro/Psych Left arm pain and weakness.  "Severe pain in the back of my neck" negative psych ROS   GI/Hepatic negative GI ROS, Neg liver ROS,   Endo/Other    Renal/GU negative Renal ROS     Musculoskeletal  (+) Arthritis -, Osteoarthritis,    Abdominal   Peds  Hematology negative hematology ROS (+)   Anesthesia Other Findings   Reproductive/Obstetrics negative OB ROS                          Anesthesia Physical Anesthesia Plan  ASA: II  Anesthesia Plan: General   Post-op Pain Management:    Induction: Intravenous  Airway Management Planned: Oral ETT  Additional Equipment:   Intra-op Plan:   Post-operative Plan: Extubation in OR  Informed Consent: I have reviewed the patients History and Physical, chart, labs and discussed the procedure including the risks, benefits and alternatives for the proposed anesthesia with the patient or authorized representative who has indicated his/her understanding and acceptance.     Plan Discussed with: CRNA and Surgeon  Anesthesia Plan Comments:         Anesthesia Quick Evaluation

## 2012-07-06 NOTE — H&P (Signed)
BP 109/53  Pulse 69  Temp 97.8 F (36.6 C) (Oral)  Resp 20  Wt 78.9 kg (173 lb 15.1 oz)  SpO2 99%  LMP 06/26/2010 HISTORY:     Courtney Larsen is a 51 year old woman who presents today for evaluation of pain and weakness in the left upper extremity.  She has had this pain she says for approximately a week.  It has been severe.  She cannot live her life normally.  She can barely sleep.  She cannot lie down flat because it causes so much pain in the left upper extremity.  She has numbness in the left upper extremity.  She likens it to a band being pressed on the arm.  She has never had pain like this in the past.  She did undergo surgery in [place] at L4-5 due to a listhesed level with spondylosis and was told at that time she had some problems in her neck, but she didn't feel then like she does today.  She has had no bowel or bladder dysfunction.  She is otherwise in good health.  She is right-handed.    PAST MEDICAL HISTORY:  Otherwise good.    FAMILY HISTORY:    Noncontributory.    PAST SURGICAL HISTORY:  She had only had the one surgery at L4-5 done November 13th of last year.    DRUG ALLERGIES:    No known drug allergies.    SOCIAL HISTORY:    She does not smoke.  She does not use alcohol.  She does not use illicit drugs.  She weighs 175 lbs.  She is 5', 9" tall.  She has a pulse of 91 on exam.    REVIEW OF SYSTEMS:   Positive for arm weakness, arm pain, both on the left side, and she did pass out recently.  She says she took both Tramadol and Soma at the same time and lost consciousness.  This was late last week.  She denies constitutional, eye, ear, nose, throat, mouth, cardiovascular, respiratory, gastrointestinal, genitourinary, skin, psychiatric, endocrine, hematologic and allergic problems.    MEDICATIONS:    Tramadol and Methocarbamol, but she is no longer taking the Methocarbamol.    EXAMINATION:    On examination she is alert, oriented x 4 and answering all questions  appropriately.  Memory, language, attention span and fund of knowledge    are normal.  She is well kempt and in obvious distress.  She has weakness at 4/5 in the left triceps, left wrist extensors.  Grip is also slightly weak at 4/5.  Intrinsics otherwise normal.  Normal strength right upper and both lower extremities.  2+ reflexes biceps, right triceps, brachioradialis bilaterally, both knees and ankles.  Trace reflex at the left triceps.  Normal muscle tone, bulk and coordination.  She has no cervical masses or bruits.  Lung which are clear.  Heart regular rhythm and rate.  No murmurs or rubs.  Pulse is good at the wrists bilaterally.  Sclera not injected.  Oral mucosa is normal.  Head normocephalic, atraumatic.  Gait is normal.  Romberg test is negative.  Pupils are equal, round and reactive to light.  Full extraocular movements.  Full visual fields.  Hearing intact to finger rub bilaterally.  Uvula elevates in the midline.  Shoulder shrug is normal.  Symmetric facies and symmetric facial sensation.    DIAGNOSTIC STUDIES:   MRI is reviewed.  What it shows is spondylitic change at 4-5, 5-6, 6-7.  She has a fairly large  bone spur eccentric to the left side at 5-6 and I don't think it is causing any compromise of the nerve root.  At 6-7 she has a large soft disc on the left side.  4-5 shows midline spur.  Foramina are widely patent.  Spinal cord has normal signal throughout.  Paraspinous soft tissues are normal.  Alignment is otherwise normal.    DIAGNOSIS:     1.  Displaced disc left C6-7.       2.  Left C7 radiculopathy.    SUMMARY:     Courtney Larsen would like to go ahead with surgery.  She is willing to proceed today.  We have time and I think that this is the best option for her.  I discussed at length why I would only do 6-7 and not include 5-6, though she does have a spur there.  That osteophyte I know has been there for some time and that is not the reason she has this acute onset of pain.  No weakness  in her biceps and quite frankly there is not overall compression of the C6 root on that side.  There is some deformation of the cord, but not enough that I think that is part of this problem.  Doing 5-6 now would place 4-5 at greater risk.  Doing a 3-level operation for a one level disc is not something which I think is even close to conscionable.  Risks and benefits, bleeding, infection, no relief, need for further surgery, fusion failure, hardware failure were all discussed.  She understands and wishes to proceed.

## 2012-07-06 NOTE — Preoperative (Signed)
Beta Blockers   Reason not to administer Beta Blockers:Not Applicable 

## 2012-07-06 NOTE — Op Note (Signed)
07/06/2012  7:11 PM  PATIENT:  Courtney Larsen  51 y.o. female with left upper extremity pain and weakness in the left triceps. Mri shows a large herniated disc on the left at C6/7  PRE-OPERATIVE DIAGNOSIS:  cervical herniated disc cervical radiculopathy C6/7  POST-OPERATIVE DIAGNOSIS:  cervical herniated disc cervical radiculopathy C6/7  PROCEDURE:  Procedure(s): ANTERIOR CERVICAL DECOMPRESSION/DISCECTOMY FUSION 1 LEVEL C6/7 Arthrodesis 8mm structural allograft C6/7 Anterior intstrumentation Trestle plate  SURGEON:  Surgeon(s): Carmela Hurt, MD Clydene Fake, MD  ASSISTANTS:hirsch  ANESTHESIA:   general  EBL:  Total I/O In: 500 [I.V.:500] Out: -   BLOOD ADMINISTERED:none  CELL SAVER GIVEN:none   COUNT:per nursing  DRAINS: none   SPECIMEN:  No Specimen  DICTATION: [name] was taken to the operating room intubated and placed under a general anesthetic. Her head was positioned on a horseshoe headrest in slight extension. Her neck was prepped and draped in a sterile fashion. I infiltrated 4cc lidocaine into the neck at the level of the cricothyroid membrane. I opened the skin with a 10 blade and took the incision down through the superficial layers of dermis. I using the Metzenbaum scissors dissected in the plane superficial to the platysma rostrally and caudally. I opened the platysma horizontally then dissected inferior to the platysma rostrally and caudally. I with both sharp and blunt technique, and with retraction of the omohyoid medially, created an avascular corridor to the spine. I placed a spinal needle at what I believed to be C6/7. I confirmed my location with an xray.  I reflected the longus colli muscles bilaterally  And placed a self retaining retractor. I incised the disc with a 15 blade. I proceeded with the discetomy using the rongeurs, curettes and Kerrison punches. I brought the microscope into the field to aid in microdissection. I removed osteophytes from  both C6 and C7. I opened the posterior longitudinal ligament with the punches and found the disc herniation. I removed with a small hook a great deal of disc material from the left side overlying the nerve root. I removed both bone and disc until I felt there was free egress of the C7 root on the left. Completing that I decompressed the rest of the canal at C6/7 along with the right C7 root. The uncovertebral joints were removed bilaterally to ensure decompression.  I used the drill to even the surfaces of C6 and C7 along with endplate to accept the graft. I sized the space and felt an 8mm graft would fit best. I placed the graft and it fit well.  With Dr. Doreen Beam assistance we placed the Trestle(Alphatek) plate with 4 14mm screws. 2 screws in each vertebral body. A final xray showed the plate, graft, and screws to be in good position.  I approximated the platysma, then the subcuticular layer with vicryl suture. I used Dermabond for a sterile dressing.   PLAN OF CARE: Admit for overnight observation  PATIENT DISPOSITION:  PACU - hemodynamically stable.   Delay start of Pharmacological VTE agent (>24hrs) due to surgical blood loss or risk of bleeding:  yes

## 2012-07-06 NOTE — Anesthesia Procedure Notes (Signed)
Procedure Name: Intubation Date/Time: 07/06/2012 5:00 PM Performed by: Tyrone Nine Pre-anesthesia Checklist: Emergency Drugs available, Suction available, Patient being monitored, Patient identified and Timeout performed Patient Re-evaluated:Patient Re-evaluated prior to inductionOxygen Delivery Method: Circle system utilized Preoxygenation: Pre-oxygenation with 100% oxygen Intubation Type: IV induction Ventilation: Mask ventilation without difficulty Laryngoscope Size: Mac and 3 Grade View: Grade I Tube type: Oral Tube size: 7.5 mm Number of attempts: 1 Airway Equipment and Method: Stylet Placement Confirmation: ETT inserted through vocal cords under direct vision,  positive ETCO2,  CO2 detector and breath sounds checked- equal and bilateral Secured at: 22 cm Tube secured with: Tape Dental Injury: Teeth and Oropharynx as per pre-operative assessment

## 2012-07-07 ENCOUNTER — Encounter (HOSPITAL_COMMUNITY): Payer: Self-pay | Admitting: Neurosurgery

## 2012-07-07 NOTE — Discharge Summary (Signed)
Physician Discharge Summary  Patient ID: Courtney Larsen MRN: [id number] DOB/AGE: 1961-04-18 51 y.o.  Admit date: 07/06/2012 Discharge date: 07/07/2012  Admission Diagnoses:  Discharge Diagnoses:  Active Problems:  * No active hospital problems. *    Discharged Condition: good  Hospital Course: [name] was admitted and taken to the operating room for a C6/7 ACDF with Trestle hardware. She tolerated the procedure quite well. Postoperatively she was able to ambulate and tolerated a regular diet. Her wound discharge is clean dry without signs of infection. She had improved strength in the left triceps and she was so weak at approximately 4-4+ over 5.  Consults: None  Significant Diagnostic Studies: None  Treatments: surgery: As of  Discharge Exam: Blood pressure 110/63, pulse 91, temperature 98.6 F (37 C), temperature source Oral, resp. rate 18, weight 78.9 kg (173 lb 15.1 oz), last menstrual period 06/26/2010, SpO2 99.00%. General appearance: alert, cooperative and appears stated age Neurologic: Mental status: Alert, oriented, thought content appropriate Cranial nerves: normal Motor: Weakness left biceps 4-4+ over 5 speaking voice normal  Disposition: 01-Home or Self Care     Medication List     As of 07/07/2012  6:41 PM    TAKE these medications         aspirin 325 MG tablet   Take 325 mg by mouth as needed. For pain      traMADol 50 MG tablet   Commonly known as: ULTRAM   Take 50 mg by mouth every 6 (six) hours as needed. For pain           Follow-up Information    Follow up with Nimisha Rathel L, MD. In 4 weeks. (call to make appt)    Contact information:   [address] [place] [address] [phone]          Signed: Chevy Virgo L 07/07/2012, 6:41 PM

## 2012-07-07 NOTE — Anesthesia Postprocedure Evaluation (Signed)
  Anesthesia Post-op Note  Patient: Courtney Larsen  Procedure(s) Performed: Procedure(s) (LRB) with comments: ANTERIOR CERVICAL DECOMPRESSION/DISCECTOMY FUSION 1 LEVEL (N/A) - Cervical six-seven anterior cervical decompression with fusion plating and bonegraft  Patient Location: PACU  Anesthesia Type: General  Level of Consciousness: awake  Airway and Oxygen Therapy: Patient Spontanous Breathing  Post-op Pain: mild  Post-op Assessment: Post-op Vital signs reviewed  Post-op Vital Signs: Reviewed  Complications: No apparent anesthesia complications

## 2012-07-31 DIAGNOSIS — M5020 Other cervical disc displacement, unspecified cervical region: Secondary | ICD-10-CM | POA: Diagnosis not present

## 2012-08-01 NOTE — Telephone Encounter (Signed)
Breast center contacted patient regarding appointment, pt said she would call back to schedule see note in appointment section.

## 2013-06-27 ENCOUNTER — Encounter: Payer: Self-pay | Admitting: Gynecology

## 2013-07-23 ENCOUNTER — Other Ambulatory Visit (INDEPENDENT_AMBULATORY_CARE_PROVIDER_SITE_OTHER): Payer: Medicare Other

## 2013-07-23 ENCOUNTER — Encounter: Payer: Self-pay | Admitting: Internal Medicine

## 2013-07-23 ENCOUNTER — Ambulatory Visit (INDEPENDENT_AMBULATORY_CARE_PROVIDER_SITE_OTHER): Payer: Medicare Other | Admitting: Internal Medicine

## 2013-07-23 VITALS — BP 112/80 | HR 72 | Temp 98.3°F | Ht 70.0 in | Wt 174.2 lb

## 2013-07-23 DIAGNOSIS — Z1239 Encounter for other screening for malignant neoplasm of breast: Secondary | ICD-10-CM

## 2013-07-23 DIAGNOSIS — R5383 Other fatigue: Secondary | ICD-10-CM

## 2013-07-23 DIAGNOSIS — Z0000 Encounter for general adult medical examination without abnormal findings: Secondary | ICD-10-CM

## 2013-07-23 DIAGNOSIS — Z1211 Encounter for screening for malignant neoplasm of colon: Secondary | ICD-10-CM | POA: Diagnosis not present

## 2013-07-23 DIAGNOSIS — R5381 Other malaise: Secondary | ICD-10-CM

## 2013-07-23 DIAGNOSIS — Z136 Encounter for screening for cardiovascular disorders: Secondary | ICD-10-CM | POA: Diagnosis not present

## 2013-07-23 LAB — URINALYSIS, ROUTINE W REFLEX MICROSCOPIC
Bilirubin Urine: NEGATIVE
Ketones, ur: 15
Nitrite: NEGATIVE
Specific Gravity, Urine: >=1.03 (ref 1.000–1.030)
Total Protein, Urine: NEGATIVE
Urine Glucose: NEGATIVE
Urobilinogen, UA: 0.2 (ref 0.0–1.0)
pH: 6 (ref 5.0–8.0)

## 2013-07-23 LAB — CBC WITH DIFFERENTIAL/PLATELET
Basophils Absolute: 0 10*3/uL (ref 0.0–0.1)
Basophils Relative: 0.5 % (ref 0.0–3.0)
Eosinophils Absolute: 0.4 10*3/uL (ref 0.0–0.7)
Eosinophils Relative: 6.6 % — ABNORMAL HIGH (ref 0.0–5.0)
HCT: 35.9 % — ABNORMAL LOW (ref 36.0–46.0)
Hemoglobin: 12 g/dL (ref 12.0–15.0)
Lymphocytes Relative: 25.5 % (ref 12.0–46.0)
Lymphs Abs: 1.5 10*3/uL (ref 0.7–4.0)
MCHC: 33.4 g/dL (ref 30.0–36.0)
MCV: 87.4 fl (ref 78.0–100.0)
Monocytes Absolute: 0.5 10*3/uL (ref 0.1–1.0)
Monocytes Relative: 8 % (ref 3.0–12.0)
Neutro Abs: 3.4 10*3/uL (ref 1.4–7.7)
Neutrophils Relative %: 59.4 % (ref 43.0–77.0)
Platelets: 298 10*3/uL (ref 150.0–400.0)
RBC: 4.11 Mil/uL (ref 3.87–5.11)
RDW: 14.3 % (ref 11.5–14.6)
WBC: 5.7 10*3/uL (ref 4.5–10.5)

## 2013-07-23 LAB — HEPATIC FUNCTION PANEL
ALT: 14 U/L (ref 0–35)
AST: 14 U/L (ref 0–37)
Albumin: 3.9 g/dL (ref 3.5–5.2)
Alkaline Phosphatase: 70 U/L (ref 39–117)
Bilirubin, Direct: 0.1 mg/dL (ref 0.0–0.3)
Total Bilirubin: 0.5 mg/dL (ref 0.3–1.2)
Total Protein: 7.8 g/dL (ref 6.0–8.3)

## 2013-07-23 LAB — BASIC METABOLIC PANEL
BUN: 14 mg/dL (ref 6–23)
CO2: 29 mEq/L (ref 19–32)
Calcium: 9.1 mg/dL (ref 8.4–10.5)
Chloride: 102 mEq/L (ref 96–112)
Creatinine, Ser: 0.8 mg/dL (ref 0.4–1.2)
GFR: 103.55 mL/min (ref >60.00)
Glucose, Bld: 89 mg/dL (ref 70–99)
Potassium: 3.9 mEq/L (ref 3.5–5.1)
Sodium: 139 mEq/L (ref 135–145)

## 2013-07-23 LAB — TSH: TSH: 0.74 u[IU]/mL (ref 0.35–5.50)

## 2013-07-23 LAB — LIPID PANEL
Cholesterol: 214 mg/dL — ABNORMAL HIGH (ref 0–200)
HDL: 61.6 mg/dL (ref >39.00)
Total CHOL/HDL Ratio: 3
Triglycerides: 25 mg/dL (ref 0.0–149.0)
VLDL: 5 mg/dL (ref 0.0–40.0)

## 2013-07-23 LAB — LDL CHOLESTEROL, DIRECT: Direct LDL: 142.6 mg/dL

## 2013-07-23 NOTE — Progress Notes (Signed)
Subjective:    Patient ID: Courtney Larsen, female    DOB: [birth year], 52 y.o.   MRN: [id number]  HPI New patient to me, here to establish care patient is here today for annual physical. Patient feels well and has no complaints.  Past Medical History  Diagnosis Date  . MI, old 2004    "stress induced" per pt  . Asthma     childhood  . Family history of bleeding or clotting disorder     ?Von Willebrand Disease: Patient, sister and mother   . Cyst of breast 06/26/2012   Family History  Problem Relation Age of Onset  . Adopted: Yes  . Diabetes Mother   . Cancer Sister 24    BRAIN TUMOR  . Breast cancer Sister 65  . Ulcers Mother   . Bleeding Disorder Mother   . Bleeding Disorder Sister    History  Substance Use Topics  . Smoking status: Never Smoker   . Smokeless tobacco: Never Used  . Alcohol Use: No    Review of Systems  Constitutional: Positive for fatigue (mild). Negative for unexpected weight change.  Respiratory: Negative for cough, shortness of breath and wheezing.   Cardiovascular: Negative for chest pain, palpitations and leg swelling.  Gastrointestinal: Negative for nausea, abdominal pain and diarrhea.  Neurological: Negative for dizziness, weakness, light-headedness and headaches.  Psychiatric/Behavioral: Negative for dysphoric mood. The patient is not nervous/anxious.   All other systems reviewed and are negative.        Objective:   Physical Exam BP 112/80  Pulse 72  Temp(Src) 98.3 F (36.8 C) (Oral)  Ht 5\' 10"  (1.778 m)  Wt 174 lb 3.2 oz (79.017 kg)  BMI 25 kg/m2  SpO2 93%  LMP 06/26/2010 Wt Readings from Last 3 Encounters:  07/23/13 174 lb 3.2 oz (79.017 kg)  07/06/12 173 lb 15.1 oz (78.9 kg)  07/06/12 173 lb 15.1 oz (78.9 kg)   Constitutional: She appears well-developed and well-nourished. No distress.  HENT: Head: Normocephalic and atraumatic. Ears: B TMs ok, no erythema or effusion; Nose: Nose normal. Mouth/Throat: Oropharynx is clear  and moist. No oropharyngeal exudate.  Eyes: Conjunctivae and EOM are normal. Pupils are equal, round, and reactive to light. No scleral icterus.  Neck: Normal range of motion. Neck supple. No JVD present. No thyromegaly present.  Breast: supervised/performed by Hardie Pulley NP-student: B breast without dimpling or skin changes - normal fibrocystic changes but no mass or tenderness - no areola lesions or nipple discharge Cardiovascular: Normal rate, regular rhythm and normal heart sounds.  No murmur heard. No BLE edema. Pulmonary/Chest: Effort normal and breath sounds normal. No respiratory distress. She has no wheezes.  Abdominal: Soft. Bowel sounds are normal. She exhibits no distension. There is no tenderness. no masses Musculoskeletal: Normal range of motion, no joint effusions. No gross deformities Neurological: She is alert and oriented to person, place, and time. No cranial nerve deficit. Coordination, balance, strength, speech and gait are normal.  Skin: Skin is warm and dry. No rash noted. No erythema.  Psychiatric: She has a normal mood and affect. Her behavior is normal. Judgment and thought content normal.   Lab Results  Component Value Date   WBC 5.1 07/06/2012   HGB 12.2 07/06/2012   HCT 37.0 07/06/2012   PLT 224 07/06/2012   GLUCOSE 91 07/06/2012   CHOL 227* 06/26/2012   TRIG 67 06/26/2012   HDL 57 06/26/2012   LDLCALC 157* 06/26/2012   NA 139 07/06/2012   K  4.7 07/06/2012   CL 102 07/06/2012   CREATININE 0.62 07/06/2012   BUN 11 07/06/2012   CO2 25 07/06/2012   TSH 1.045 06/26/2012        Assessment & Plan:   CPX/v70.0 - Patient has been counseled on age-appropriate routine health concerns for screening and prevention. These are reviewed and up-to-date. Immunizations are up-to-date or declined. Labs ordered and reviewed.  ECG today - (v81.0): sinus @ 66 bpm - no ischemic changes or arrythmia  Fatigue - nonspecific symptoms/exam - check screening labs

## 2013-07-23 NOTE — Patient Instructions (Addendum)
It was good to see you today.  We have reviewed your prior records including labs and tests today  Health Maintenance reviewed - all recommended immunizations and age-appropriate screenings are up-to-date.  Test(s) ordered today. Your results will be released to MyChart (or called to you) after review, usually within 72hours after test completion. If any changes need to be made, you will be notified at that same time.  Medications reviewed and updated, no changes recommended at this time.  we'll make referral to gastroenterology for colonoscopy screening and for mammography screening. Our office will contact you regarding appointment(s) once made.  Please schedule followup in 12 months, call sooner if problems.  Health Maintenance, Females A healthy lifestyle and preventative care can promote health and wellness.  Maintain regular health, dental, and eye exams.  Eat a healthy diet. Foods like vegetables, fruits, whole grains, low-fat dairy products, and lean protein foods contain the nutrients you need without too many calories. Decrease your intake of foods high in solid fats, added sugars, and salt. Get information about a proper diet from your caregiver, if necessary.  Regular physical exercise is one of the most important things you can do for your health. Most adults should get at least 150 minutes of moderate-intensity exercise (any activity that increases your heart rate and causes you to sweat) each week. In addition, most adults need muscle-strengthening exercises on 2 or more days a week.   Maintain a healthy weight. The body mass index (BMI) is a screening tool to identify possible weight problems. It provides an estimate of body fat based on height and weight. Your caregiver can help determine your BMI, and can help you achieve or maintain a healthy weight. For adults 20 years and older:  A BMI below 18.5 is considered underweight.  A BMI of 18.5 to 24.9 is normal.  A BMI of  25 to 29.9 is considered overweight.  A BMI of 30 and above is considered obese.  Maintain normal blood lipids and cholesterol by exercising and minimizing your intake of saturated fat. Eat a balanced diet with plenty of fruits and vegetables. Blood tests for lipids and cholesterol should begin at age 73 and be repeated every 5 years. If your lipid or cholesterol levels are high, you are over 50, or you are a high risk for heart disease, you may need your cholesterol levels checked more frequently.Ongoing high lipid and cholesterol levels should be treated with medicines if diet and exercise are not effective.  If you smoke, find out from your caregiver how to quit. If you do not use tobacco, do not start.  If you are pregnant, do not drink alcohol. If you are breastfeeding, be very cautious about drinking alcohol. If you are not pregnant and choose to drink alcohol, do not exceed 1 drink per day. One drink is considered to be 12 ounces (355 mL) of beer, 5 ounces (148 mL) of wine, or 1.5 ounces (44 mL) of liquor.  Avoid use of street drugs. Do not share needles with anyone. Ask for help if you need support or instructions about stopping the use of drugs.  High blood pressure causes heart disease and increases the risk of stroke. Blood pressure should be checked at least every 1 to 2 years. Ongoing high blood pressure should be treated with medicines, if weight loss and exercise are not effective.  If you are 37 to 54 years old, ask your caregiver if you should take aspirin to prevent strokes.  Diabetes  screening involves taking a blood sample to check your fasting blood sugar level. This should be done once every 3 years, after age 78, if you are within normal weight and without risk factors for diabetes. Testing should be considered at a younger age or be carried out more frequently if you are overweight and have at least 1 risk factor for diabetes.  Breast cancer screening is essential  preventative care for women. You should practice "breast self-awareness." This means understanding the normal appearance and feel of your breasts and may include breast self-examination. Any changes detected, no matter how small, should be reported to a caregiver. Women in their 27s and 30s should have a clinical breast exam (CBE) by a caregiver as part of a regular health exam every 1 to 3 years. After age 61, women should have a CBE every year. Starting at age 76, women should consider having a mammogram (breast X-ray) every year. Women who have a family history of breast cancer should talk to their caregiver about genetic screening. Women at a high risk of breast cancer should talk to their caregiver about having an MRI and a mammogram every year.  The Pap test is a screening test for cervical cancer. Women should have a Pap test starting at age 95. Between ages 34 and 33, Pap tests should be repeated every 2 years. Beginning at age 58, you should have a Pap test every 3 years as long as the past 3 Pap tests have been normal. If you had a hysterectomy for a problem that was not cancer or a condition that could lead to cancer, then you no longer need Pap tests. If you are between ages 59 and 68, and you have had normal Pap tests going back 10 years, you no longer need Pap tests. If you have had past treatment for cervical cancer or a condition that could lead to cancer, you need Pap tests and screening for cancer for at least 20 years after your treatment. If Pap tests have been discontinued, risk factors (such as a new sexual partner) need to be reassessed to determine if screening should be resumed. Some women have medical problems that increase the chance of getting cervical cancer. In these cases, your caregiver may recommend more frequent screening and Pap tests.  The human papillomavirus (HPV) test is an additional test that may be used for cervical cancer screening. The HPV test looks for the virus that  can cause the cell changes on the cervix. The cells collected during the Pap test can be tested for HPV. The HPV test could be used to screen women aged 5 years and older, and should be used in women of any age who have unclear Pap test results. After the age of 42, women should have HPV testing at the same frequency as a Pap test.  Colorectal cancer can be detected and often prevented. Most routine colorectal cancer screening begins at the age of 87 and continues through age 29. However, your caregiver may recommend screening at an earlier age if you have risk factors for colon cancer. On a yearly basis, your caregiver may provide home test kits to check for hidden blood in the stool. Use of a small camera at the end of a tube, to directly examine the colon (sigmoidoscopy or colonoscopy), can detect the earliest forms of colorectal cancer. Talk to your caregiver about this at age 59, when routine screening begins. Direct examination of the colon should be repeated every 5 to  10 years through age 46, unless early forms of pre-cancerous polyps or small growths are found.  Hepatitis C blood testing is recommended for all people born from [birth year] through 1967 and any individual with known risks for hepatitis C.  Practice safe sex. Use condoms and avoid high-risk sexual practices to reduce the spread of sexually transmitted infections (STIs). Sexually active women aged 8 and younger should be checked for Chlamydia, which is a common sexually transmitted infection. Older women with new or multiple partners should also be tested for Chlamydia. Testing for other STIs is recommended if you are sexually active and at increased risk.  Osteoporosis is a disease in which the bones lose minerals and strength with aging. This can result in serious bone fractures. The risk of osteoporosis can be identified using a bone density scan. Women ages 22 and over and women at risk for fractures or osteoporosis should discuss  screening with their caregivers. Ask your caregiver whether you should be taking a calcium supplement or vitamin D to reduce the rate of osteoporosis.  Menopause can be associated with physical symptoms and risks. Hormone replacement therapy is available to decrease symptoms and risks. You should talk to your caregiver about whether hormone replacement therapy is right for you.  Use sunscreen with a sun protection factor (SPF) of 30 or greater. Apply sunscreen liberally and repeatedly throughout the day. You should seek shade when your shadow is shorter than you. Protect yourself by wearing long sleeves, pants, a wide-brimmed hat, and sunglasses year round, whenever you are outdoors.  Notify your caregiver of new moles or changes in moles, especially if there is a change in shape or color. Also notify your caregiver if a mole is larger than the size of a pencil eraser.  Stay current with your immunizations. Document Released: 03/29/2011 Document Revised: 12/06/2011 Document Reviewed: 03/29/2011 [facility] Patient Information 2014 [place], [place].

## 2013-07-23 NOTE — Progress Notes (Signed)
Pre-visit discussion using our clinic review tool. No additional management support is needed unless otherwise documented below in the visit note.  

## 2013-07-24 ENCOUNTER — Encounter: Payer: Self-pay | Admitting: *Deleted

## 2013-08-27 ENCOUNTER — Ambulatory Visit: Payer: Medicare Other

## 2013-09-03 ENCOUNTER — Encounter: Payer: Self-pay | Admitting: Internal Medicine

## 2013-09-11 ENCOUNTER — Encounter: Payer: Self-pay | Admitting: Internal Medicine

## 2013-11-02 ENCOUNTER — Ambulatory Visit (AMBULATORY_SURGERY_CENTER): Payer: Self-pay | Admitting: *Deleted

## 2013-11-02 VITALS — Ht 70.0 in | Wt 178.4 lb

## 2013-11-02 DIAGNOSIS — Z1211 Encounter for screening for malignant neoplasm of colon: Secondary | ICD-10-CM

## 2013-11-02 MED ORDER — NA SULFATE-K SULFATE-MG SULF 17.5-3.13-1.6 GM/177ML PO SOLN: 1.0000 | Freq: Once | ORAL | Status: DC

## 2013-11-02 NOTE — Progress Notes (Signed)
No allergies to eggs or soy. No problems with anesthesia.  

## 2013-11-16 ENCOUNTER — Ambulatory Visit (AMBULATORY_SURGERY_CENTER): Payer: Medicare Other | Admitting: Internal Medicine

## 2013-11-16 ENCOUNTER — Encounter: Payer: Self-pay | Admitting: Internal Medicine

## 2013-11-16 VITALS — BP 133/56 | HR 73 | Temp 98.2°F | Resp 33 | Ht 70.0 in | Wt 178.0 lb

## 2013-11-16 DIAGNOSIS — I252 Old myocardial infarction: Secondary | ICD-10-CM | POA: Diagnosis not present

## 2013-11-16 DIAGNOSIS — D126 Benign neoplasm of colon, unspecified: Secondary | ICD-10-CM

## 2013-11-16 DIAGNOSIS — Z1211 Encounter for screening for malignant neoplasm of colon: Secondary | ICD-10-CM | POA: Diagnosis not present

## 2013-11-16 DIAGNOSIS — D6800 Von Willebrand disease, unspecified: Secondary | ICD-10-CM | POA: Diagnosis not present

## 2013-11-16 DIAGNOSIS — K5730 Diverticulosis of large intestine without perforation or abscess without bleeding: Secondary | ICD-10-CM

## 2013-11-16 MED ORDER — SODIUM CHLORIDE 0.9 % IV SOLN: 500.0000 mL | INTRAVENOUS | Status: DC

## 2013-11-16 NOTE — Patient Instructions (Addendum)
I found and removed one polyp today. It looked benign. You also have a condition called diverticulosis - common and not usually a problem. Please read the handout provided.  I will let you know pathology results and when to have another routine colonoscopy by mail.  I appreciate the opportunity to care for you. Gatha Mayer, MD, FACG  YOU HAD AN ENDOSCOPIC PROCEDURE TODAY AT [facility]: Refer to the procedure report that was given to you for any specific questions about what was found during the examination.  If the procedure report does not answer your questions, please call your gastroenterologist to clarify.  If you requested that your care partner not be given the details of your procedure findings, then the procedure report has been included in a sealed envelope for you to review at your convenience later.  YOU SHOULD EXPECT: Some feelings of bloating in the abdomen. Passage of more gas than usual.  Walking can help get rid of the air that was put into your GI tract during the procedure and reduce the bloating. If you had a lower endoscopy (such as a colonoscopy or flexible sigmoidoscopy) you may notice spotting of blood in your stool or on the toilet paper. If you underwent a bowel prep for your procedure, then you may not have a normal bowel movement for a few days.  DIET: Your first meal following the procedure should be a light meal and then it is ok to progress to your normal diet.  A half-sandwich or bowl of soup is an example of a good first meal.  Heavy or fried foods are harder to digest and may make you feel nauseous or bloated.  Likewise meals heavy in dairy and vegetables can cause extra gas to form and this can also increase the bloating.  Drink plenty of fluids but you should avoid alcoholic beverages for 24 hours.  ACTIVITY: Your care partner should take you home directly after the procedure.  You should plan to take it easy, moving slowly for the rest of the  day.  You can resume normal activity the day after the procedure however you should NOT DRIVE or use heavy machinery for 24 hours (because of the sedation medicines used during the test).    SYMPTOMS TO REPORT IMMEDIATELY: A gastroenterologist can be reached at any hour.  During normal business hours, 8:30 AM to 5:00 PM Monday through Friday, call [phone].  After hours and on weekends, please call the GI answering service at [phone] who will take a message and have the physician on call contact you.   Following lower endoscopy (colonoscopy or flexible sigmoidoscopy):  Excessive amounts of blood in the stool  Significant tenderness or worsening of abdominal pains  Swelling of the abdomen that is new, acute  Fever of 100F or higher FOLLOW UP: If any biopsies were taken you will be contacted by phone or by letter within the next 1-3 weeks.  Call your gastroenterologist if you have not heard about the biopsies in 3 weeks.  Our staff will call the home number listed on your records the next business day following your procedure to check on you and address any questions or concerns that you may have at that time regarding the information given to you following your procedure. This is a courtesy call and so if there is no answer at the home number and we have not heard from you through the emergency physician on call, we will assume that  you have returned to your regular daily activities without incident.  SIGNATURES/CONFIDENTIALITY: You and/or your care partner have signed paperwork which will be entered into your electronic medical record.  These signatures attest to the fact that that the information above on your After Visit Summary has been reviewed and is understood.  Full responsibility of the confidentiality of this discharge information lies with you and/or your care-partner.  Recommendations Timing of repeat colonoscopy will be determined by pathology findings.

## 2013-11-16 NOTE — Op Note (Signed)
[place]  [address] [place], [address]   COLONOSCOPY PROCEDURE REPORT  PATIENT: Courtney Larsen, Courtney Larsen  MR#: [id number] BIRTHDATE: 03/29/1961 , 37  yrs. old GENDER: Female ENDOSCOPIST: Gatha Mayer, MD, [facility] REFERRED DX:[id number] Asa Lente, M.D. PROCEDURE DATE:  11/16/2013 PROCEDURE:   Colonoscopy with snare polypectomy First Screening Colonoscopy - Avg.  risk and is 50 yrs.  old or older Yes.  Prior Negative Screening - Now for repeat screening. N/A  History of Adenoma - Now for follow-up colonoscopy & has been > or = to 3 yrs.  N/A  Polyps Removed Today? Yes. ASA CLASS:   Class II INDICATIONS:average risk screening and first colonoscopy. MEDICATIONS: propofol (Diprivan) 250mg  IV, MAC sedation, administered by CRNA, and These medications were titrated to patient response per physician's verbal order  DESCRIPTION OF PROCEDURE:   After the risks benefits and alternatives of the procedure were thoroughly explained, informed consent was obtained.  A digital rectal exam revealed no abnormalities of the rectum.   The LB [id number]  endoscope was introduced through the anus and advanced to the cecum, which was identified by both the appendix and ileocecal valve. No adverse events experienced.   The quality of the prep was Suprep good  The instrument was then slowly withdrawn as the colon was fully examined.  COLON FINDINGS: A sessile polyp measuring 1 cm in size was found in the ascending colon.  A polypectomy was performed piecemeal with a cold snare.  The resection was complete and the polyp tissue was completely retrieved.   Moderate diverticulosis was noted in the sigmoid colon.   The colon mucosa was otherwise normal. Retroflexed views revealed no abnormalities. The time to cecum=3 minutes 24 seconds.  Withdrawal time=14 minutes 14 seconds.  The scope was withdrawn and the procedure completed. COMPLICATIONS: There were no complications.  ENDOSCOPIC  IMPRESSION: 1.   Sessile polyp measuring 1 cm in size was found in the ascending colon; polypectomy was performed with a cold snare 2.   Moderate diverticulosis was noted in the sigmoid colon 3.   The colon mucosa was otherwise normal - good prep - first colonoscopy  RECOMMENDATIONS: Timing of repeat colonoscopy will be determined by pathology findings.   eSigned:  Gatha Mayer, MD, [facility] 11/16/2013 11:27 AM   cc: Rowe Clack, MD and The Patient

## 2013-11-16 NOTE — Progress Notes (Signed)
Called to room to assist during endoscopic procedure.  Patient ID and intended procedure confirmed with present staff. Received instructions for my participation in the procedure from the performing physician.  

## 2013-11-16 NOTE — Progress Notes (Signed)
Procedure ends, to recovery, report given and VSS. 

## 2013-11-19 ENCOUNTER — Telehealth: Payer: Self-pay | Admitting: *Deleted

## 2013-11-19 NOTE — Telephone Encounter (Signed)
  Follow up Call-  Call back number 11/16/2013  Post procedure Call Back phone  # [phone]  Permission to leave phone message Yes     Patient questions:  Do you have a fever, pain , or abdominal swelling? no Pain Score  0 *  Have you tolerated food without any problems? yes  Have you been able to return to your normal activities? yes  Do you have any questions about your discharge instructions: Diet   no Medications  no Follow up visit  no  Do you have questions or concerns about your Care? no  Actions: * If pain score is 4 or above: No action needed, pain <4.

## 2013-11-20 ENCOUNTER — Encounter: Payer: Self-pay | Admitting: Internal Medicine

## 2013-11-20 DIAGNOSIS — Z860100 Personal history of colon polyps, unspecified: Secondary | ICD-10-CM

## 2013-11-20 HISTORY — DX: Personal history of colonic polyps: Z86.010

## 2013-11-20 HISTORY — DX: Personal history of colon polyps, unspecified: Z86.0100

## 2013-11-20 NOTE — Progress Notes (Signed)
Quick Note:  1 cm sessile serrated polyp Repeat colon 2018 ______

## 2013-11-21 ENCOUNTER — Encounter: Payer: Self-pay | Admitting: *Deleted

## 2013-11-23 ENCOUNTER — Telehealth: Payer: Self-pay | Admitting: Internal Medicine

## 2013-11-23 NOTE — Telephone Encounter (Signed)
Patient scheduled for 01/10/14.  She would like to be on the cancellation list.  She is not not sure if she can keep the appt for 4/16/.  She is aware that she will be called if there is an opening earlier.

## 2013-11-26 NOTE — Addendum Note (Signed)
Addended by: Lowry Ram on: 11/26/2013 05:16 PM   Modules accepted: Level of Service

## 2014-01-10 ENCOUNTER — Encounter: Payer: Self-pay | Admitting: Internal Medicine

## 2014-01-10 ENCOUNTER — Ambulatory Visit (INDEPENDENT_AMBULATORY_CARE_PROVIDER_SITE_OTHER): Payer: Medicare Other | Admitting: Internal Medicine

## 2014-01-10 VITALS — BP 126/74 | HR 84 | Ht 69.0 in | Wt 172.6 lb

## 2014-01-10 DIAGNOSIS — R140 Abdominal distension (gaseous): Secondary | ICD-10-CM

## 2014-01-10 DIAGNOSIS — R141 Gas pain: Secondary | ICD-10-CM | POA: Diagnosis not present

## 2014-01-10 DIAGNOSIS — R142 Eructation: Secondary | ICD-10-CM | POA: Diagnosis not present

## 2014-01-10 DIAGNOSIS — R109 Unspecified abdominal pain: Secondary | ICD-10-CM | POA: Diagnosis not present

## 2014-01-10 DIAGNOSIS — K5900 Constipation, unspecified: Secondary | ICD-10-CM

## 2014-01-10 DIAGNOSIS — R143 Flatulence: Secondary | ICD-10-CM

## 2014-01-10 NOTE — Patient Instructions (Signed)
You have been scheduled for an endoscopy with propofol. Please follow written instructions given to you at your visit today. If you use inhalers (even only as needed), please bring them with you on the day of your procedure. Your physician has requested that you go to www.startemmi.com and enter the access code given to you at your visit today. This web site gives a general overview about your procedure. However, you should still follow specific instructions given to you by our office regarding your preparation for the procedure.  Today we are providing you with a benefiber handout to read and follow.  Try to use 2-3 tablespoons daily.   I appreciate the opportunity to care for you.

## 2014-01-10 NOTE — Progress Notes (Signed)
         Subjective:    Patient ID: Courtney Larsen, female    DOB: [birth year], 53 y.o.   MRN: [id number]  HPI Patient is here with complaints of rather diffuse intermittent abdominal pain. There's soreness and bloating and she has some difficulty with defecation at times with straining. She moves her bowels every day and a lot of times are hard despite increasing fiber with fruits and vegetables. She is concerned that there could be a problem in the upper GI tract. She recently had a screening colonoscopy with a 1 cm serrated polyp and diverticulosis  Medications, allergies, past medical history, past surgical history, family history and social history are reviewed and updated in the EMR.  Review of Systems As above    Objective:   Physical Exam General:  NAD Eyes:   anicteric Lungs:  clear Heart:  S1S2 no rubs, murmurs or gallops Abdomen:  soft and mildly tender diffusely, BS+ Ext:   no edema  Rectal exam with female chaperone present  Anoderm inspection revealed small anal tags Anal wink was + Digital exam revealed normal resting tone and voluntary squeeze. No mass present + small-mod rectocele Simulated defecation with valsalva revealed appropriate abdominal contraction and descent.       Assessment & Plan:  Abdominal pain of multiple sites  Bloating  Unspecified constipation  1) Benefiber 2-3 tbsp daily 2) Upper endoscopy o evaluate abdominal pain The risks and benefits as well as alternatives of endoscopic procedure(s) have been discussed and reviewed. All questions answered. The patient agrees to proceed.

## 2014-02-01 ENCOUNTER — Encounter: Payer: Self-pay | Admitting: Internal Medicine

## 2014-02-01 ENCOUNTER — Ambulatory Visit (AMBULATORY_SURGERY_CENTER): Payer: Medicare Other | Admitting: Internal Medicine

## 2014-02-01 VITALS — BP 113/78 | HR 67 | Temp 97.4°F | Resp 19 | Ht 69.0 in | Wt 172.0 lb

## 2014-02-01 DIAGNOSIS — A048 Other specified bacterial intestinal infections: Secondary | ICD-10-CM | POA: Diagnosis not present

## 2014-02-01 DIAGNOSIS — K2990 Gastroduodenitis, unspecified, without bleeding: Secondary | ICD-10-CM

## 2014-02-01 DIAGNOSIS — K2970 Gastritis, unspecified, without bleeding: Secondary | ICD-10-CM | POA: Diagnosis not present

## 2014-02-01 DIAGNOSIS — K2940 Chronic atrophic gastritis without bleeding: Secondary | ICD-10-CM | POA: Diagnosis not present

## 2014-02-01 DIAGNOSIS — R109 Unspecified abdominal pain: Secondary | ICD-10-CM | POA: Diagnosis not present

## 2014-02-01 DIAGNOSIS — I252 Old myocardial infarction: Secondary | ICD-10-CM | POA: Diagnosis not present

## 2014-02-01 MED ORDER — SODIUM CHLORIDE 0.9 % IV SOLN: 500.0000 mL | INTRAVENOUS | Status: DC

## 2014-02-01 NOTE — Patient Instructions (Addendum)
There were changes in the stomach lining that suggest inflammation called gastritis - often not a problem but I took biopsies to see. All else was ok.  We will let you know results and plans when pathology results are in.  I appreciate the opportunity to care for you. Gatha Mayer, MD, [facility]   Discharge instructions given with verbal understanding. Handout on gastritis. Resume previous medications. YOU HAD AN ENDOSCOPIC PROCEDURE TODAY AT [facility]: Refer to the procedure report that was given to you for any specific questions about what was found during the examination.  If the procedure report does not answer your questions, please call your gastroenterologist to clarify.  If you requested that your care partner not be given the details of your procedure findings, then the procedure report has been included in a sealed envelope for you to review at your convenience later.  YOU SHOULD EXPECT: Some feelings of bloating in the abdomen. Passage of more gas than usual.  Walking can help get rid of the air that was put into your GI tract during the procedure and reduce the bloating. If you had a lower endoscopy (such as a colonoscopy or flexible sigmoidoscopy) you may notice spotting of blood in your stool or on the toilet paper. If you underwent a bowel prep for your procedure, then you may not have a normal bowel movement for a few days.  DIET: Your first meal following the procedure should be a light meal and then it is ok to progress to your normal diet.  A half-sandwich or bowl of soup is an example of a good first meal.  Heavy or fried foods are harder to digest and may make you feel nauseous or bloated.  Likewise meals heavy in dairy and vegetables can cause extra gas to form and this can also increase the bloating.  Drink plenty of fluids but you should avoid alcoholic beverages for 24 hours.  ACTIVITY: Your care partner should take you home directly after the procedure.   You should plan to take it easy, moving slowly for the rest of the day.  You can resume normal activity the day after the procedure however you should NOT DRIVE or use heavy machinery for 24 hours (because of the sedation medicines used during the test).    SYMPTOMS TO REPORT IMMEDIATELY: A gastroenterologist can be reached at any hour.  During normal business hours, 8:30 AM to 5:00 PM Monday through Friday, call [phone].  After hours and on weekends, please call the GI answering service at [phone] who will take a message and have the physician on call contact you.   Following upper endoscopy (EGD)  Vomiting of blood or coffee ground material  New chest pain or pain under the shoulder blades  Painful or persistently difficult swallowing  New shortness of breath  Fever of 100F or higher  Black, tarry-looking stools  FOLLOW UP: If any biopsies were taken you will be contacted by phone or by letter within the next 1-3 weeks.  Call your gastroenterologist if you have not heard about the biopsies in 3 weeks.  Our staff will call the home number listed on your records the next business day following your procedure to check on you and address any questions or concerns that you may have at that time regarding the information given to you following your procedure. This is a courtesy call and so if there is no answer at the home number and we have  heard from you through the emergency physician on call, we will assume that you have returned to your regular daily activities without incident.  SIGNATURES/CONFIDENTIALITY: You and/or your care partner have signed paperwork which will be entered into your electronic medical record.  These signatures attest to the fact that that the information above on your After Visit Summary has been reviewed and is understood.  Full responsibility of the confidentiality of this discharge information lies with you and/or your care-partner. 

## 2014-02-01 NOTE — Op Note (Signed)
[place]  [address] [place], [address]   ENDOSCOPY PROCEDURE REPORT  PATIENT: Courtney, Larsen  MR#: [id number] BIRTHDATE: [birth year] , 1  yrs. old GENDER: Female ENDOSCOPIST: Gatha Mayer, MD, [facility] PROCEDURE DATE:  02/01/2014 PROCEDURE:  EGD w/ biopsy ASA CLASS:     Class II INDICATIONS:  Dyspepsia.   abdominal pain. MEDICATIONS: propofol (Diprivan) 150mg  IV, MAC sedation, administered by CRNA, and These medications were titrated to patient response per physician's verbal order TOPICAL ANESTHETIC: Cetacaine Spray  DESCRIPTION OF PROCEDURE: After the risks benefits and alternatives of the procedure were thoroughly explained, informed consent was obtained.  The LB [id number] endoscope was introduced through the mouth and advanced to the second portion of the duodenum. Without limitations.  The instrument was slowly withdrawn as the mucosa was fully examined.        STOMACH: Mild non-erosive gastritis (inflammation) was found in the gastric body and gastric fundus.  Multiple biopsies were performed using cold forceps.  Sample sent for histology.  The remainder of the upper endoscopy exam was otherwise normal. Retroflexed views revealed gastritis.     The scope was then withdrawn from the patient and the procedure completed.  COMPLICATIONS: There were no complications. ENDOSCOPIC IMPRESSION: 1.   Non-erosive gastritis (inflammation) was found in the gastric body and gastric fundus; multiple biopsies 2.   The remainder of the upper endoscopy exam was otherwise normal  RECOMMENDATIONS: Await biopsy results    eSigned:  Gatha Mayer, MD, [facility] 02/01/2014 2:29 PM   CC:The Patient

## 2014-02-04 ENCOUNTER — Telehealth: Payer: Self-pay | Admitting: *Deleted

## 2014-02-04 NOTE — Telephone Encounter (Signed)
No answer. Number identifier. Message left to call if any questions or concerns. 

## 2014-02-05 ENCOUNTER — Encounter: Payer: Self-pay | Admitting: Internal Medicine

## 2014-02-05 DIAGNOSIS — K2970 Gastritis, unspecified, without bleeding: Secondary | ICD-10-CM

## 2014-02-05 DIAGNOSIS — B9681 Helicobacter pylori [H. pylori] as the cause of diseases classified elsewhere: Secondary | ICD-10-CM

## 2014-02-05 HISTORY — DX: Helicobacter pylori (H. pylori) as the cause of diseases classified elsewhere: B96.81

## 2014-02-05 NOTE — Progress Notes (Signed)
Quick Note:  Please tell her she has H. Pylori gastritis I recommend Tx w/ Pylera please  No letter or recall from [facility] ______

## 2014-02-06 ENCOUNTER — Other Ambulatory Visit: Payer: Self-pay

## 2014-02-06 MED ORDER — OMEPRAZOLE 20 MG PO CPDR: 20.0000 mg | DELAYED_RELEASE_CAPSULE | Freq: Two times a day (BID) | ORAL | Status: DC

## 2014-02-06 MED ORDER — BIS SUBCIT-METRONID-TETRACYC 140-125-125 MG PO CAPS: 3.0000 | ORAL_CAPSULE | Freq: Three times a day (TID) | ORAL | Status: DC

## 2014-02-07 ENCOUNTER — Telehealth: Payer: Self-pay | Admitting: Internal Medicine

## 2014-02-07 MED ORDER — BIS SUBCIT-METRONID-TETRACYC 140-125-125 MG PO CAPS: 3.0000 | ORAL_CAPSULE | Freq: Three times a day (TID) | ORAL | Status: DC

## 2014-02-07 NOTE — Telephone Encounter (Signed)
I was able to find a sample kit of the pylera to supply patient with since she was unable to pay $200.00.  Patient was notified of this and sample put up front for pick up.  Questions regarding PPI answered as well.

## 2014-03-05 ENCOUNTER — Encounter (INDEPENDENT_AMBULATORY_CARE_PROVIDER_SITE_OTHER): Payer: Self-pay

## 2014-03-05 ENCOUNTER — Encounter: Payer: Self-pay | Admitting: Emergency Medicine

## 2014-03-05 ENCOUNTER — Ambulatory Visit (INDEPENDENT_AMBULATORY_CARE_PROVIDER_SITE_OTHER)
Admission: RE | Admit: 2014-03-05 | Discharge: 2014-03-05 | Disposition: A | Discharge status: Home / Self Care | Payer: Medicare Other | Source: Ambulatory Visit | Attending: Emergency Medicine | Admitting: Emergency Medicine

## 2014-03-05 ENCOUNTER — Ambulatory Visit (INDEPENDENT_AMBULATORY_CARE_PROVIDER_SITE_OTHER): Payer: Medicare Other | Admitting: Emergency Medicine

## 2014-03-05 VITALS — BP 128/76 | HR 111 | Ht 69.0 in | Wt 172.6 lb

## 2014-03-05 DIAGNOSIS — J4520 Mild intermittent asthma, uncomplicated: Secondary | ICD-10-CM

## 2014-03-05 DIAGNOSIS — J45909 Unspecified asthma, uncomplicated: Secondary | ICD-10-CM

## 2014-03-05 DIAGNOSIS — R0789 Other chest pain: Secondary | ICD-10-CM | POA: Diagnosis not present

## 2014-03-05 DIAGNOSIS — Z77090 Contact with and (suspected) exposure to asbestos: Secondary | ICD-10-CM | POA: Diagnosis not present

## 2014-03-05 DIAGNOSIS — R0602 Shortness of breath: Secondary | ICD-10-CM | POA: Diagnosis not present

## 2014-03-05 MED ORDER — ALBUTEROL SULFATE HFA 108 (90 BASE) MCG/ACT IN AERS: 2.0000 | INHALATION_SPRAY | Freq: Four times a day (QID) | RESPIRATORY_TRACT | Status: DC | PRN

## 2014-03-05 NOTE — Progress Notes (Signed)
Subjective:    Patient ID: Courtney Larsen, female    DOB: [birth year], 53 y.o.   MRN: [id number]  HPI 53 yo never smoker, hx of allergies and PND, anemia. No childhood asthma. She presents today to eval intermittent dyspnea that started in her 30's. She has noticed that humidity, some fumes and spices, smoke. She develops chest tightness, has trouble talking. She can take albuterol and feel better. She coughs and can bring up clear mucous. She has used been using Primatene for the last week because she ran out of albuterol.   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    Review of Systems  Constitutional: Negative for fever and unexpected weight change.  HENT: Positive for congestion, postnasal drip and sinus pressure. Negative for dental problem, ear pain, nosebleeds, rhinorrhea, sneezing, sore throat and trouble swallowing.   Eyes: Negative for redness and itching.  Respiratory: Positive for cough, chest tightness, shortness of breath and wheezing.   Cardiovascular: Negative for palpitations and leg swelling.  Gastrointestinal: Negative for nausea and vomiting.  Genitourinary: Negative for dysuria.  Musculoskeletal: Negative for joint swelling.  Skin: Negative for rash.  Neurological: Negative for headaches.  Hematological: Does not bruise/bleed easily.  Psychiatric/Behavioral: Negative for dysphoric mood. The patient is not nervous/anxious.     Past Medical History  Diagnosis Date  . MI, old 2004    "stress induced" per pt  . Family history of bleeding or clotting disorder     ?Von Willebrand Disease: Patient, sister and mother   . Cyst of breast 06/26/2012    left - resolved on f/u per pt  . Personal history of colonic polyp - sessile serrated polyp 11/20/2013  . Diverticulosis   . Allergy     SEASONAL  . Anemia   . Helicobacter pylori gastritis 02/05/2014    EGD 01/2014     Family History  Problem Relation Age of Onset  . Adopted: Yes  . Diabetes  Mother   . Ulcers Mother   . Bleeding Disorder Mother   . Cancer Sister 7    BRAIN TUMOR  . Breast cancer Sister 30  . Bleeding Disorder Sister   . Colon cancer Neg Hx      History   Social History  . Marital Status: Single    Spouse Name: N/A    Number of Children: N/A  . Years of Education: N/A   Occupational History  . Not on file.   Social History Main Topics  . Smoking status: Never Smoker   . Smokeless tobacco: Never Used  . Alcohol Use: No  . Drug Use: No  . Sexual Activity: Yes   Other Topics Concern  . 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   No Known Allergies   Outpatient Prescriptions Prior to Visit  Medication Sig Dispense Refill  . omeprazole (PRILOSEC) 20 MG capsule Take 1 capsule (20 mg total) by mouth 2 (two) times daily.  20 capsule  0  . bismuth-metronidazole-tetracycline (PYLERA) 140-125-125 MG per capsule Take 3 capsules by mouth 4 (four) times daily -  before meals and at bedtime.  120 capsule  0   No facility-administered medications prior to visit.        Objective:   Physical Exam Filed Vitals:  03/05/14 1547  BP: 128/76  Pulse: 111  Height: 5\' 9"  (1.753 m)  Weight: 172 lb 9.6 oz (78.291 kg)  SpO2: 93%   Gen: Pleasant, well-nourished, in no distress,  normal affect  ENT: No lesions,  mouth clear,  oropharynx clear, no postnasal drip  Neck: No JVD, no TMG, no carotid bruits, some soft insp stridor  Lungs: No use of accessory muscles, distant, clear without rales or rhonchi  Cardiovascular: RRR, heart sounds normal, no murmur or gallops, no peripheral edema  Musculoskeletal: No deformities, no cyanosis or clubbing  Neuro: alert, non focal  Skin: Warm, no lesions or rashes      Assessment & Plan:  Asbestos exposure Suspected asbestos exposure from work when she was in [place]. No known CXR or  workup in the past.  - CXR today, consider CT chest in future if findings support this   Asthma, mild intermittent Her symptoms and presentation are consistent with intermittent asthma. She is typically well controlled as long as she has access to albuterol. In fact she indicated that she probably would not have come to this appointment if she had not run out of her albuterol. She has never had formal pulmonary function testing.  - Will restart albuterol 2 puffs when necessary - Will perform full PFT - Followup after her testing to review - Suspect she will not need scheduled medication at this time

## 2014-03-05 NOTE — Addendum Note (Signed)
Addended by: Carlos American A on: 03/05/2014 04:43 PM   Modules accepted: Orders

## 2014-03-05 NOTE — Assessment & Plan Note (Signed)
Her symptoms and presentation are consistent with intermittent asthma. She is typically well controlled as long as she has access to albuterol. In fact she indicated that she probably would not have come to this appointment if she had not run out of her albuterol. She has never had formal pulmonary function testing.  - Will restart albuterol 2 puffs when necessary - Will perform full PFT - Followup after her testing to review - Suspect she will not need scheduled medication at this time

## 2014-03-05 NOTE — Patient Instructions (Signed)
We will perform pulmonary function testing at your next office visit We will perform a CXR today Please use albuterol 2 puffs as needed for shortness of breath Follow with Dr Lamonte Sakai next available with full PFT same day.

## 2014-03-05 NOTE — Assessment & Plan Note (Signed)
Suspected asbestos exposure from work when she was in [place]. No known CXR or workup in the past.  - CXR today, consider CT chest in future if findings support this

## 2014-03-07 ENCOUNTER — Telehealth: Payer: Self-pay | Admitting: Emergency Medicine

## 2014-03-07 NOTE — Telephone Encounter (Signed)
Result Notes    Notes Recorded by Collene Gobble, MD on 03/06/2014 at 10:11 AM Please let the patinet know that I do not see any evidence of asbestos exposure on her CXR. Thanks.   I spoke with patient about results and she verbalized understanding and had no questions

## 2014-03-22 ENCOUNTER — Ambulatory Visit (INDEPENDENT_AMBULATORY_CARE_PROVIDER_SITE_OTHER): Payer: Medicare Other | Admitting: Emergency Medicine

## 2014-03-22 DIAGNOSIS — J45909 Unspecified asthma, uncomplicated: Secondary | ICD-10-CM | POA: Diagnosis not present

## 2014-03-22 DIAGNOSIS — J4520 Mild intermittent asthma, uncomplicated: Secondary | ICD-10-CM

## 2014-03-22 NOTE — Progress Notes (Signed)
PFT done today. 

## 2014-03-23 LAB — PULMONARY FUNCTION TEST
DL/VA % pred: 128 %
DL/VA: 6.79 ml/min/mmHg/L
DLCO unc % pred: 70 %
DLCO unc: 21.36 ml/min/mmHg
FEF 25-75 Post: 1.49 L/sec
FEF 25-75 Pre: 0.85 L/sec
FEF2575-%Change-Post: 74 %
FEF2575-%Pred-Post: 57 %
FEF2575-%Pred-Pre: 32 %
FEV1-%Change-Post: 18 %
FEV1-%Pred-Post: 68 %
FEV1-%Pred-Pre: 57 %
FEV1-Post: 1.8 L
FEV1-Pre: 1.52 L
FEV1FVC-%Change-Post: 10 %
FEV1FVC-%Pred-Pre: 80 %
FEV6-%Change-Post: 7 %
FEV6-%Pred-Post: 77 %
FEV6-%Pred-Pre: 72 %
FEV6-Post: 2.49 L
FEV6-Pre: 2.33 L
FEV6FVC-%Change-Post: 0 %
FEV6FVC-%Pred-Post: 102 %
FEV6FVC-%Pred-Pre: 103 %
FVC-%Change-Post: 7 %
FVC-%Pred-Post: 75 %
FVC-%Pred-Pre: 70 %
FVC-Post: 2.5 L
FVC-Pre: 2.33 L
Post FEV1/FVC ratio: 72 %
Post FEV6/FVC ratio: 100 %
Pre FEV1/FVC ratio: 65 %
Pre FEV6/FVC Ratio: 100 %
RV % pred: 162 %
RV: 3.43 L
TLC % pred: 99 %
TLC: 5.73 L

## 2014-03-27 ENCOUNTER — Telehealth: Payer: Self-pay | Admitting: Emergency Medicine

## 2014-03-27 NOTE — Telephone Encounter (Signed)
I called spoke with pt. She is requesting her PFT results. Pt aware RB not in office this week. Please advise thanks

## 2014-04-01 NOTE — Telephone Encounter (Signed)
Please let the patient know that her PFT show evidence for moderately severe asthma. We will need to discuss her inhaled medications next OV to decide if we should add more.

## 2014-04-01 NOTE — Telephone Encounter (Signed)
Spoke with pt and advised of results and rec's per RB. Pt verbalized understanding and had no further questions

## 2014-04-04 ENCOUNTER — Ambulatory Visit (INDEPENDENT_AMBULATORY_CARE_PROVIDER_SITE_OTHER): Payer: Medicare Other | Admitting: Emergency Medicine

## 2014-04-04 ENCOUNTER — Encounter: Payer: Self-pay | Admitting: Emergency Medicine

## 2014-04-04 VITALS — BP 130/82 | HR 78 | Ht 69.0 in | Wt 171.8 lb

## 2014-04-04 DIAGNOSIS — R059 Cough, unspecified: Secondary | ICD-10-CM

## 2014-04-04 DIAGNOSIS — R05 Cough: Secondary | ICD-10-CM

## 2014-04-04 DIAGNOSIS — J45909 Unspecified asthma, uncomplicated: Secondary | ICD-10-CM

## 2014-04-04 DIAGNOSIS — J4520 Mild intermittent asthma, uncomplicated: Secondary | ICD-10-CM

## 2014-04-04 MED ORDER — BUDESONIDE-FORMOTEROL FUMARATE 160-4.5 MCG/ACT IN AERO: 2.0000 | INHALATION_SPRAY | Freq: Two times a day (BID) | RESPIRATORY_TRACT | Status: DC

## 2014-04-04 MED ORDER — LORATADINE 10 MG PO TABS: 10.0000 mg | ORAL_TABLET | Freq: Every day | ORAL | Status: DC

## 2014-04-04 MED ORDER — FLUTICASONE PROPIONATE 50 MCG/ACT NA SUSP: 2.0000 | Freq: Every day | NASAL | Status: DC

## 2014-04-04 MED ORDER — ESOMEPRAZOLE MAGNESIUM 40 MG PO CPDR: 40.0000 mg | DELAYED_RELEASE_CAPSULE | Freq: Every day | ORAL | Status: DC

## 2014-04-04 NOTE — Assessment & Plan Note (Signed)
?   How much of this is asthma vs UA irritatation - will treat w Symbicort, also try to address reflux and PND - start loratadine + fluticasone - start nexium

## 2014-04-04 NOTE — Patient Instructions (Signed)
Please start Symbicort 2 puffs twice a day  Start loratadine 10mg  daily Start fluticasone nasal spray, 2 sprays each nostril daily Start nexium 40mg  daily.  Use your albuterol 2 puffs if needed for shortness of breath or cough Follow with Dr Lamonte Sakai in 1 month

## 2014-04-04 NOTE — Assessment & Plan Note (Signed)
-   moderately severe AFL on spiro + BD response - start symbicort.

## 2014-04-04 NOTE — Progress Notes (Signed)
   Subjective:    Patient ID: Courtney Larsen, female    DOB: 1960/12/11, 53 y.o.   MRN: [id number]  HPI 53 yo never smoker, hx of allergies and PND, anemia. No childhood asthma. She presents today to eval intermittent dyspnea that started in her 30's. She has noticed that humidity, some fumes and spices, smoke. She develops chest tightness, has trouble talking. She can take albuterol and feel better. She coughs and can bring up clear mucous. She has used been using Primatene for the last week because she ran out of albuterol.   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   ROV 04/04/14 -- follows for suspected asthma. PFT done 6/26 > moderately severe AFL with a BD response. She has freq cough, has tried to use albuterol at these times without much effect. She is blowing her nose.     Review of Systems  Constitutional: Negative for fever and unexpected weight change.  HENT: Positive for congestion, postnasal drip and sinus pressure. Negative for dental problem, ear pain, nosebleeds, rhinorrhea, sneezing, sore throat and trouble swallowing.   Eyes: Negative for redness and itching.  Respiratory: Positive for cough, chest tightness, shortness of breath and wheezing.   Cardiovascular: Negative for palpitations and leg swelling.  Gastrointestinal: Negative for nausea and vomiting.  Genitourinary: Negative for dysuria.  Musculoskeletal: Negative for joint swelling.  Skin: Negative for rash.  Neurological: Negative for headaches.  Hematological: Does not bruise/bleed easily.  Psychiatric/Behavioral: Negative for dysphoric mood. The patient is not nervous/anxious.        Objective:   Physical Exam Filed Vitals:   04/04/14 1005  BP: 130/82  Pulse: 78  Height: 5\' 9"  (1.753 m)  Weight: 171 lb 12.8 oz (77.928 kg)  SpO2: 95%   Gen: Pleasant, well-nourished, in no distress,  normal affect  ENT: No lesions,  mouth clear,  oropharynx clear, no postnasal drip  Neck: No  JVD, no TMG, no carotid bruits, some soft insp stridor  Lungs: No use of accessory muscles, distant, clear without rales or rhonchi  Cardiovascular: RRR, heart sounds normal, no murmur or gallops, no peripheral edema  Musculoskeletal: No deformities, no cyanosis or clubbing  Neuro: alert, non focal  Skin: Warm, no lesions or rashes      Assessment & Plan:  Cough ? How much of this is asthma vs UA irritatation - will treat w Symbicort, also try to address reflux and PND - start loratadine + fluticasone - start nexium   Asthma, mild intermittent - moderately severe AFL on spiro + BD response - start symbicort.

## 2014-05-24 ENCOUNTER — Encounter: Payer: Self-pay | Admitting: Emergency Medicine

## 2014-05-24 ENCOUNTER — Ambulatory Visit (INDEPENDENT_AMBULATORY_CARE_PROVIDER_SITE_OTHER): Payer: Medicare Other | Admitting: Emergency Medicine

## 2014-05-24 VITALS — BP 130/74 | HR 71 | Ht 69.0 in | Wt 177.0 lb

## 2014-05-24 DIAGNOSIS — J4520 Mild intermittent asthma, uncomplicated: Secondary | ICD-10-CM

## 2014-05-24 DIAGNOSIS — J45909 Unspecified asthma, uncomplicated: Secondary | ICD-10-CM | POA: Diagnosis not present

## 2014-05-24 MED ORDER — LORATADINE 10 MG PO TABS
10.0000 mg | ORAL_TABLET | Freq: Every day | ORAL | Status: DC
Start: 2014-05-24 — End: 2015-04-29

## 2014-05-24 MED ORDER — ALBUTEROL SULFATE HFA 108 (90 BASE) MCG/ACT IN AERS: 2.0000 | INHALATION_SPRAY | Freq: Four times a day (QID) | RESPIRATORY_TRACT | Status: DC | PRN

## 2014-05-24 NOTE — Patient Instructions (Signed)
Please restart loratadine (Claritin) 10mg  daily Have your ProAir available to use 2 puffs as needed for shortness of breath or wheeze.  Get the Flu Shot this Fall Follow with Dr Lamonte Sakai in 6 months or sooner if you have any problems

## 2014-05-24 NOTE — Assessment & Plan Note (Signed)
Doing well. Still with mucous in the am. Not currently on meds - restart loratadine - ProAir prn - low threshold to to start an ICS if she continues to have sx or if SABA use increases.  - rov 6

## 2014-05-24 NOTE — Progress Notes (Signed)
   Subjective:    Patient ID: Courtney Larsen, female    DOB: 1961-08-15, 53 y.o.   MRN: [id number]  HPI 53 yo never smoker, hx of allergies and PND, anemia. No childhood asthma. She presents today to eval intermittent dyspnea that started in her 30's. She has noticed that humidity, some fumes and spices, smoke. She develops chest tightness, has trouble talking. She can take albuterol and feel better. She coughs and can bring up clear mucous. She has used been using Primatene for the last week because she ran out of albuterol.   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   ROV 04/04/14 -- follows for suspected asthma. PFT done 6/26 > moderately severe AFL with a BD response. She has freq cough, has tried to use albuterol at these times without much effect. She is blowing her nose.    ROV 05/24/14 -- follow up visit for asthma and chronic cough with allergies.  Last time we started nexium, loratadine, fluticasone. She discovered mold in her house that had to be cleaned out. Her cough is improved.  She has albuterol available, uses SABA occasionally. She Took Symbicort but not sure she benefited. She is finished with nexium, loratadine, fluticasone.   Review of Systems  Constitutional: Negative for fever and unexpected weight change.  HENT: Positive for congestion, postnasal drip and sinus pressure. Negative for dental problem, ear pain, nosebleeds, rhinorrhea, sneezing, sore throat and trouble swallowing.   Eyes: Negative for redness and itching.  Respiratory: Positive for cough, chest tightness, shortness of breath and wheezing.   Cardiovascular: Negative for palpitations and leg swelling.  Gastrointestinal: Negative for nausea and vomiting.  Genitourinary: Negative for dysuria.  Musculoskeletal: Negative for joint swelling.  Skin: Negative for rash.  Neurological: Negative for headaches.  Hematological: Does not bruise/bleed easily.  Psychiatric/Behavioral: Negative for  dysphoric mood. The patient is not nervous/anxious.        Objective:   Physical Exam Filed Vitals:   05/24/14 0915  BP: 130/74  Pulse: 71  Height: 5\' 9"  (1.753 m)  Weight: 177 lb (80.287 kg)  SpO2: 100%   Gen: Pleasant, well-nourished, in no distress,  normal affect  ENT: No lesions,  mouth clear,  oropharynx clear, no postnasal drip  Neck: No JVD, no TMG, no carotid bruits, some soft insp stridor  Lungs: No use of accessory muscles, distant, clear without rales or rhonchi  Cardiovascular: RRR, heart sounds normal, no murmur or gallops, no peripheral edema  Musculoskeletal: No deformities, no cyanosis or clubbing  Neuro: alert, non focal  Skin: Warm, no lesions or rashes      Assessment & Plan:  Asthma, mild intermittent Doing well. Still with mucous in the am. Not currently on meds - restart loratadine - ProAir prn - low threshold to to start an ICS if she continues to have sx or if SABA use increases.  - rov 6

## 2014-07-29 ENCOUNTER — Encounter: Payer: Self-pay | Admitting: Emergency Medicine

## 2015-01-01 ENCOUNTER — Other Ambulatory Visit: Payer: Self-pay | Admitting: Gynecology

## 2015-01-30 ENCOUNTER — Telehealth: Payer: Self-pay

## 2015-01-30 NOTE — Telephone Encounter (Signed)
Patient faxed order to [facility] that you provided her with on 07/03/2012   At that visit you wrote "She will be referred to the radiology service for diagnostic mammogram of the left breast due to the report in [place] stating that at the left breast 3:00 position 7 cm from the areolar region there was a breast cyst ".  [facility] needed clarification on the order. My question for you is are you comfortable with them going on and doing diagnostic mammo based on that exam and report from several years ago or do you need to see her first for a new order?

## 2015-01-30 NOTE — Telephone Encounter (Signed)
I tried 4 x to contact patient. Phone just rings and rings and eventually will stop ringing. No machine.

## 2015-01-30 NOTE — Telephone Encounter (Signed)
Absolutely, I will need to do a complete exam on her. I have not seen her in 3 years. I will then determine if she needs a diagnostic mammogram at that time

## 2015-01-31 NOTE — Telephone Encounter (Signed)
Anderson Malta informed patient. CE scheduled for her with Dr. Moshe Salisbury.

## 2015-02-11 ENCOUNTER — Encounter: Payer: Self-pay | Admitting: Gynecology

## 2015-03-11 ENCOUNTER — Other Ambulatory Visit (HOSPITAL_COMMUNITY)
Admission: RE | Admit: 2015-03-11 | Discharge: 2015-03-11 | Disposition: A | Discharge status: Home / Self Care | Payer: Medicare Other | Source: Ambulatory Visit | Attending: Gynecology | Admitting: Gynecology

## 2015-03-11 ENCOUNTER — Ambulatory Visit (INDEPENDENT_AMBULATORY_CARE_PROVIDER_SITE_OTHER): Payer: Medicare Other | Admitting: Gynecology

## 2015-03-11 ENCOUNTER — Encounter: Payer: Self-pay | Admitting: Gynecology

## 2015-03-11 VITALS — BP 128/86 | Ht 69.0 in | Wt 177.0 lb

## 2015-03-11 DIAGNOSIS — N6002 Solitary cyst of left breast: Secondary | ICD-10-CM | POA: Diagnosis not present

## 2015-03-11 DIAGNOSIS — R87619 Unspecified abnormal cytological findings in specimens from cervix uteri: Secondary | ICD-10-CM | POA: Insufficient documentation

## 2015-03-11 DIAGNOSIS — Z01419 Encounter for gynecological examination (general) (routine) without abnormal findings: Secondary | ICD-10-CM

## 2015-03-11 DIAGNOSIS — Z124 Encounter for screening for malignant neoplasm of cervix: Secondary | ICD-10-CM | POA: Insufficient documentation

## 2015-03-11 DIAGNOSIS — Z780 Asymptomatic menopausal state: Secondary | ICD-10-CM | POA: Diagnosis not present

## 2015-03-11 DIAGNOSIS — Z1151 Encounter for screening for human papillomavirus (HPV): Secondary | ICD-10-CM | POA: Insufficient documentation

## 2015-03-11 NOTE — Progress Notes (Signed)
Courtney Larsen 1961-07-21 [id number]   History:    54 y.o.  for annual gyn exam has not been seen in the office since October 2013. Her history is as follows:  Patient was seen for the first time as a new patient to our practice in October 2013 whereby she had moved from [place] several months prior to that. Her medical history as described by her was as follows: Patient with breast cancer history in her sister in her late 30s. Patient herself had a left breast cyst in 2011 that was followed with ultrasound and recommended followup but she moved out of town. He was described as being located on the left breast at the 3:00 position 7 cm from the areolar region. Patient previously screened for the BRCA one BRCA2 gene mutation and not detected.   Patient with history of right ovarian cyst in March of 2012 an incidental finding on ultrasound demonstrated an endometrial polyp.   family history of von Willebrand's disease (sister, mother and patient)  Patient has been menopausal for the past 3 years and has never been on hormone replacement therapy and denies any symptoms or any vaginal bleeding.  Patient denies any past history of abnormal Paps.  On the office visit of October 7 and ultrasound was done which demonstrated the following: Uterus measures 7.5 x 4.5 x 3.7 cm with endometrial stripe of 3.8 mm. Right and left ovary otherwise normal. Previously reported ovarian cyst not seen on the right. Sonohysterogram no intracavitary defect and left ovary was normal.  Patient was set up for an appointment with the radiologist for diagnostic mammogram of the left breast due to the report in [place] stating that at the left breast 3:00 position 7 cm from the areolar region there was a breast cyst but did not follow-up. She did have her colonoscopy last year benign polyps were removed. She also had EGD it sounds like she had just gastritis. She's currently not taking her calcium or vitamin D.  She denies any vasomotor symptoms. She stated that her menopause started in her late 21s. She has not had a bone density study as of yet.  Past medical history,surgical history, family history and social history were all reviewed and documented in the EPIC chart.  Gynecologic History Patient's last menstrual period was 06/26/2010. Contraception: post menopausal status Last Pap: 2013. Results were: normal Last mammogram: See above. Results were: See above  Obstetric History OB History  Gravida Para Term Preterm AB SAB TAB Ectopic Multiple Living  _0 # Outcome Date GA Lbr Len/2nd Weight Sex Delivery Anes PTL Lv  3 Term     M VBAC, Forcep  N Y  2 Term     M Vag-Spont  N Y  1 Preterm     M CS-Unspec  Y Y       ROS: A ROS was performed and pertinent positives and negatives are included in the history.  GENERAL: No fevers or chills. HEENT: No change in vision, no earache, sore throat or sinus congestion. NECK: No pain or stiffness. CARDIOVASCULAR: No chest pain or pressure. No palpitations. PULMONARY: No shortness of breath, cough or wheeze. GASTROINTESTINAL: No abdominal pain, nausea, vomiting or diarrhea, melena or bright red blood per rectum. GENITOURINARY: No urinary frequency, urgency, hesitancy or dysuria. MUSCULOSKELETAL: No joint or muscle pain, no back pain, no recent trauma. DERMATOLOGIC: No rash, no itching, no lesions. ENDOCRINE:  No polyuria, polydipsia, no heat or cold intolerance. No recent change in weight. HEMATOLOGICAL: No anemia or easy bruising or bleeding. NEUROLOGIC: No headache, seizures, numbness, tingling or weakness. PSYCHIATRIC: No depression, no loss of interest in normal activity or change in sleep pattern.     Exam: chaperone present  BP 128/86 mmHg  Ht _0  (1.753 m)  Wt 177 lb (80.287 kg)  BMI 26.13 kg/m2  LMP 06/26/2010  Body mass index is 26.13 kg/(m^2).  General appearance : Well developed well nourished female. No acute  distress HEENT: Eyes: no retinal hemorrhage or exudates,  Neck supple, trachea midline, no carotid bruits, no thyroidmegaly Lungs: Clear to auscultation, no rhonchi or wheezes, or rib retractions  Heart: Regular rate and rhythm, no murmurs or gallops Breast:Examined in sitting and supine position were symmetrical in appearance, no palpable masses or tenderness,  no skin retraction, no nipple inversion, no nipple discharge, no skin discoloration, no axillary or supraclavicular lymphadenopathy Abdomen: no palpable masses or tenderness, no rebound or guarding Extremities: no edema or skin discoloration or tenderness  Pelvic:  Bartholin, Urethra, Skene Glands: Within normal limits             Vagina: No gross lesions or discharge  Cervix: No gross lesions or discharge  Uterus  anteverted, normal size, shape and consistency, non-tender and mobile  Adnexa  Without masses or tenderness  Anus and perineum  normal   Rectovaginal  normal sphincter tone without palpated masses or tenderness             Hemoccult PCP will provide     Assessment/Plan:  54 y.o. female for annual exam who has not been seen since 2013. We are point to schedule a bone density study here in our office. We are also going to reschedule once again for a diagnostic mammogram of the left breast as a result of the findings several years ago in [place] of the left breast cyst at the 3:00 position 7 cm from the areolar region whether there was a questionable cyst or not. Her PCP we'll be doing her blood work. We discussed importance of calcium vitamin D and regular exercise for osteoporosis prevention. Pap smear with HPV screening was done today.   Terrance Mass MD, 2:21 PM 03/11/2015

## 2015-03-12 ENCOUNTER — Telehealth: Payer: Self-pay | Admitting: *Deleted

## 2015-03-12 DIAGNOSIS — N6012 Diffuse cystic mastopathy of left breast: Secondary | ICD-10-CM

## 2015-03-12 NOTE — Telephone Encounter (Signed)
-----   Message from Terrance Mass, MD sent at 03/11/2015  2:29 PM EDT ----- Please schedule diagnostic mammogram on this patient who several years in [place] was informed that she had a cystic area of the left breast 3:00 position 7 cm from the areolar region but she never followed up. She will need a diagnostic mammogram and possible ultrasound of that left breast and screening mammogram of the right.

## 2015-03-12 NOTE — Telephone Encounter (Signed)
Order placed at [facility] center,they will contact pt to schedule.

## 2015-03-13 LAB — CYTOLOGY - PAP

## 2015-03-13 NOTE — Telephone Encounter (Signed)
Appointment on 03/28/15 @ 10:00am at [facility], pt aware she must get films from previous imaging office in [place]

## 2015-03-28 ENCOUNTER — Other Ambulatory Visit: Payer: PRIVATE HEALTH INSURANCE

## 2015-04-09 ENCOUNTER — Ambulatory Visit
Admission: RE | Admit: 2015-04-09 | Discharge: 2015-04-09 | Disposition: A | Discharge status: Home / Self Care | Payer: Medicare Other | Source: Ambulatory Visit | Attending: Gynecology | Admitting: Gynecology

## 2015-04-09 DIAGNOSIS — N6002 Solitary cyst of left breast: Secondary | ICD-10-CM | POA: Diagnosis not present

## 2015-04-09 DIAGNOSIS — N6012 Diffuse cystic mastopathy of left breast: Secondary | ICD-10-CM

## 2015-04-21 ENCOUNTER — Telehealth: Payer: Self-pay | Admitting: Internal Medicine

## 2015-04-21 ENCOUNTER — Encounter: Payer: PRIVATE HEALTH INSURANCE | Admitting: Family

## 2015-04-21 NOTE — Telephone Encounter (Signed)
Patient no showed today for CPE with Marya Amsler.  Please advise.

## 2015-04-21 NOTE — Telephone Encounter (Signed)
Got rescheduled

## 2015-04-21 NOTE — Telephone Encounter (Signed)
Call to see if they are able to reschedule.

## 2015-04-29 ENCOUNTER — Other Ambulatory Visit (INDEPENDENT_AMBULATORY_CARE_PROVIDER_SITE_OTHER): Payer: Medicare Other

## 2015-04-29 ENCOUNTER — Telehealth: Payer: Self-pay | Admitting: Family

## 2015-04-29 ENCOUNTER — Ambulatory Visit (INDEPENDENT_AMBULATORY_CARE_PROVIDER_SITE_OTHER): Payer: Medicare Other | Admitting: Family

## 2015-04-29 ENCOUNTER — Encounter: Payer: Self-pay | Admitting: Family

## 2015-04-29 VITALS — BP 120/82 | HR 67 | Temp 97.9°F | Resp 18 | Ht 69.0 in | Wt 180.0 lb

## 2015-04-29 DIAGNOSIS — E785 Hyperlipidemia, unspecified: Secondary | ICD-10-CM

## 2015-04-29 DIAGNOSIS — Z0000 Encounter for general adult medical examination without abnormal findings: Secondary | ICD-10-CM | POA: Diagnosis not present

## 2015-04-29 LAB — CBC
HCT: 38.2 % (ref 36.0–46.0)
Hemoglobin: 12.5 g/dL (ref 12.0–15.0)
MCHC: 32.8 g/dL (ref 30.0–36.0)
MCV: 88.2 fl (ref 78.0–100.0)
Platelets: 213 10*3/uL (ref 150.0–400.0)
RBC: 4.33 Mil/uL (ref 3.87–5.11)
RDW: 14.7 % (ref 11.5–15.5)
WBC: 3.8 10*3/uL — ABNORMAL LOW (ref 4.0–10.5)

## 2015-04-29 LAB — LIPID PANEL
Cholesterol: 222 mg/dL — ABNORMAL HIGH (ref 0–200)
HDL: 63.5 mg/dL (ref >39.00)
LDL Cholesterol: 145 mg/dL — ABNORMAL HIGH (ref 0–99)
NonHDL: 158.77
Total CHOL/HDL Ratio: 4
Triglycerides: 70 mg/dL (ref 0.0–149.0)
VLDL: 14 mg/dL (ref 0.0–40.0)

## 2015-04-29 LAB — BASIC METABOLIC PANEL
BUN: 10 mg/dL (ref 6–23)
CO2: 33 mEq/L — ABNORMAL HIGH (ref 19–32)
Calcium: 9.4 mg/dL (ref 8.4–10.5)
Chloride: 104 mEq/L (ref 96–112)
Creatinine, Ser: 0.7 mg/dL (ref 0.40–1.20)
GFR: 111.41 mL/min (ref >60.00)
Glucose, Bld: 88 mg/dL (ref 70–99)
Potassium: 4.1 mEq/L (ref 3.5–5.1)
Sodium: 140 mEq/L (ref 135–145)

## 2015-04-29 NOTE — Telephone Encounter (Signed)
Please inform patient that her blood work shows that her white/red blood cells, kidney function and electrolytes are all within the normal limits. Her cholesterol shows that her LDL is slightly elevated above the goal of 100 which can be reduced by eating a diet that is lower in fat and avoiding saturated fats. Her HDL was high which helps to reduce her cardiovascular risk factors. Please continue with healthy lifestyle choices and follow up in 1 year or sooner if needed.

## 2015-04-29 NOTE — Assessment & Plan Note (Signed)
Reviewed and updated patient's medical, surgical, family and social history. Medications and allergies were also reviewed. Basic screenings for depression, activities of daily living, hearing, cognition and safety were performed. Provider list was updated and health plan was provided to the patient.    1) Anticipatory Guidance: Discussed importance of wearing a seatbelt while driving and not texting while driving; changing batteries in smoke detector at least once annually; wearing suntan lotion when outside; eating a balanced and moderate diet; getting physical activity at least 30 minutes per day.  2) Immunizations / Screenings / Labs:  All immunizations are up-to-date. All screenings are up-to-date per recommendations. Obtain CBC, BMET, and lipid profile based on elevated total cholesterol previously.   Overall well exam. Minimal risk factors for cardiovascular/chronic diseases presently. Continue current healthy lifestyle choices. Discussed the importance of Advanced directives. Follow up prevention exam in 1 year and follow up office visit pending lab work.

## 2015-04-29 NOTE — Progress Notes (Signed)
Pre visit review using our clinic review tool, if applicable. No additional management support is needed unless otherwise documented below in the visit note. 

## 2015-04-29 NOTE — Progress Notes (Signed)
Subjective:    Patient ID: Courtney Larsen, female    DOB: [birth year], 54 y.o.   MRN: [id number]  Chief Complaint  Patient presents with  . CPE    Fasting    HPI:  Courtney Larsen is a 54 y.o. female who presents today for an annual wellness visit.   1) Health Maintenance -   Diet - Multiple small meals throughout the day consisting vegetables, chicken, juicing, almond milk, fruits. 1-2 cups of caffeine daily  Exercise - Walks and moves around all day; occasionally to the gym.   2) Preventative Exams / Immunizations:  Dental -- Due for an exam  Vision -- Up to date   Health Maintenance  Topic Date Due  . Hepatitis C Screening  1960-11-28  . HIV Screening  07/15/1974  . INFLUENZA VACCINE  04/28/2015  . COLONOSCOPY  11/16/2016  . MAMMOGRAM  04/08/2017  . PAP SMEAR  03/10/2018  . TETANUS/TDAP  06/26/2022     Immunization History  Administered Date(s) Administered  . Pneumococcal Polysaccharide-23 07/07/2012  . Tdap 06/26/2012    RISK FACTORS  Tobacco History  Smoking status  . Never Smoker   Smokeless tobacco  . Never Used     Cardiac risk factors: none.  Depression Screen  Q1: Over the past two weeks, have you felt down, depressed or hopeless? No  Q2: Over the past two weeks, have you felt little interest or pleasure in doing things? No  Have you lost interest or pleasure in daily life? No  Do you often feel hopeless? No  Do you cry easily over simple problems? No  Activities of Daily Living In your present state of health, do you have any difficulty performing the following activities?:  Driving? No Managing money?  Going to take a course in that Feeding yourself? No Getting from bed to chair? No Climbing a flight of stairs? No Preparing food and eating?: No Bathing or showering? No Getting dressed: No Getting to the toilet? No Using the toilet: No Moving around from place to place: No In the past year have you fallen or had a near  fall?:No   Home Safety Has smoke detector and wears seat belts. No firearms. No excess sun exposure. Are there smokers in your home (other than you)?  No Do you feel safe at home?  Yes  Hearing Difficulties: No Do you often ask people to speak up or repeat themselves? No Do you experience ringing or noises in your ears? No  Do you have difficulty understanding soft or whispered voices? No    Cognitive Testing  Alert? Yes   Normal Appearance? Yes  Oriented to person? Yes  Place? Yes   Time? Yes  Recall of three objects?  Yes  Can perform simple calculations? Yes  Displays appropriate judgment? Yes  Can read the correct time from a watch face? Yes  Do you feel that you have a problem with memory? No  Do you often misplace items? No   Advanced Directives have been discussed with the patient? Yes  Current Physicians/Providers and Suppliers  1. Gwendolyn Grant, MD - Primary Care Provider / Internal Medicine 2.. Uvaldo Rising, MD - Gynecology 3. Baltazar Apo, MD - Pulmonology 4. Silvano Rusk, MD - Gastroenterology   Indicate any recent Medical Services you may have received from other than [facility] providers in the past year (date may be approximate).  All answers were reviewed with the patient and necessary referrals were made:  Mauricio Po, [place]  04/29/2015    No Known Allergies   Outpatient Prescriptions Prior to Visit  Medication Sig Dispense Refill  . albuterol (PROAIR HFA) 108 (90 BASE) MCG/ACT inhaler Inhale 2 puffs into the lungs every 6 (six) hours as needed for wheezing or shortness of breath. (Patient not taking: Reported on 03/11/2015) 1 Inhaler 3  . aspirin 81 MG tablet Take 81 mg by mouth daily.    Marland Kitchen loratadine (CLARITIN) 10 MG tablet Take 1 tablet (10 mg total) by mouth daily. (Patient not taking: Reported on 03/11/2015) 30 tablet 11   No facility-administered medications prior to visit.     Past Medical History  Diagnosis Date  . MI, old 2004    "stress  induced" per pt  . Family history of bleeding or clotting disorder     ?Von Willebrand Disease: Patient, sister and mother   . Cyst of breast 06/26/2012    left - resolved on f/u per pt  . Personal history of colonic polyp - sessile serrated polyp 11/20/2013  . Diverticulosis   . Allergy     SEASONAL  . Anemia   . Helicobacter pylori gastritis 02/05/2014    EGD 01/2014     Past Surgical History  Procedure Laterality Date  . Back surgery  2011    LOW BACK  . Cesarean section  1988  . Anterior cervical decomp/discectomy fusion  07/06/2012    Procedure: ANTERIOR CERVICAL DECOMPRESSION/DISCECTOMY FUSION 1 LEVEL;  Surgeon: Winfield Cunas, MD;  Location: [facility];  Service: Neurosurgery;  Laterality: N/A;  Cervical six-seven anterior cervical decompression with fusion plating and bonegraft  . Colonoscopy  11/16/2013    with snare polypectomy     Family History  Problem Relation Age of Onset  . Adopted: Yes  . Diabetes Mother   . Ulcers Mother   . Bleeding Disorder Mother   . Cancer Sister 28    BRAIN TUMOR  . Breast cancer Sister 73  . Bleeding Disorder Sister   . Colon cancer Neg Hx      History   Social History  . Marital Status: Single    Spouse Name: N/A  . Number of Children: 3  . Years of Education: 14   Occupational History  . Retired    Social History Main Topics  . Smoking status: Never Smoker   . Smokeless tobacco: Never Used  . Alcohol Use: No  . Drug Use: No  . Sexual Activity: Yes   Other Topics Concern  . 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 to [place] in 2013   Lives with spouse   Fun: Hanging out with friends, outdoors, eating and cooking.   Denies abuse and feels safe at home.      Review of Systems  Constitutional: Denies fever, chills, fatigue, or significant weight gain/loss. HENT: Head: Denies headache or neck pain Ears: Denies changes in hearing, ringing in ears,  earache, drainage Nose: Denies discharge, stuffiness, itching, nosebleed, sinus pain Throat: Denies sore throat, hoarseness, dry mouth, sores, thrush Eyes: Denies loss/changes in vision, pain, redness, blurry/double vision, flashing lights Cardiovascular: Denies chest pain/discomfort, tightness, palpitations, shortness of breath with activity, difficulty lying down, swelling, sudden awakening with shortness of breath Respiratory: Denies shortness of breath, cough, sputum production, wheezing Gastrointestinal: Denies dysphasia, heartburn, change in appetite, nausea, change in bowel habits, rectal bleeding, constipation, diarrhea, yellow skin or eyes Genitourinary: Denies frequency,  urgency, burning/pain, blood in urine, incontinence, change in urinary strength. Musculoskeletal: Denies muscle/joint pain, stiffness, back pain, redness or swelling of joints, trauma Skin: Denies rashes, lumps, itching, dryness, color changes, or hair/nail changes Neurological: Denies dizziness, fainting, seizures, weakness, numbness, tingling, tremor Psychiatric - Denies nervousness, stress, depression or memory loss Endocrine: Denies heat or cold intolerance, sweating, frequent urination, excessive thirst, changes in appetite Hematologic: Denies ease of bruising or bleeding    Objective:     BP 120/82 mmHg  Pulse 67  Temp(Src) 97.9 F (36.6 C) (Oral)  Resp 18  Ht 5\' 9"  (1.753 m)  Wt 180 lb (81.647 kg)  BMI 26.57 kg/m2  SpO2 97%  LMP 06/26/2010 Nursing note and vital signs reviewed.  Physical Exam  Constitutional: She is oriented to person, place, and time. She appears well-developed and well-nourished. No distress.  Cardiovascular: Normal rate, regular rhythm, normal heart sounds and intact distal pulses.   Pulmonary/Chest: Effort normal and breath sounds normal.  Neurological: She is alert and oriented to person, place, and time.  Skin: Skin is warm and dry.  Psychiatric: She has a normal mood and  affect. Her behavior is normal. Judgment and thought content normal.      Assessment & Plan:   During the course of the visit the patient was educated and counseled about appropriate screening and preventive services including:    Pneumococcal vaccine   Influenza vaccine  Colorectal cancer screening  Nutrition counseling   Diet review for nutrition referral? Yes ____  Not Indicated _X___   Patient Instructions (the written plan) was given to the patient.  Medicare Attestation I have personally reviewed: The patient's medical and social history Their use of alcohol, tobacco or illicit drugs Their current medications and supplements The patient's functional ability including ADLs,fall risks, home safety risks, cognitive, and hearing and visual impairment Diet and physical activities Evidence for depression or mood disorders  The patient's weight, height, BMI,  have been recorded in the chart.  I have made referrals, counseling, and provided education to the patient based on review of the above and I have provided the patient with a written personalized care plan for preventive services.     Mauricio Po, [place]   04/29/2015    Problem List Items Addressed This Visit      Other   Medicare annual wellness visit, subsequent - Primary    Reviewed and updated patient's medical, surgical, family and social history. Medications and allergies were also reviewed. Basic screenings for depression, activities of daily living, hearing, cognition and safety were performed. Provider list was updated and health plan was provided to the patient.    1) Anticipatory Guidance: Discussed importance of wearing a seatbelt while driving and not texting while driving; changing batteries in smoke detector at least once annually; wearing suntan lotion when outside; eating a balanced and moderate diet; getting physical activity at least 30 minutes per day.  2) Immunizations / Screenings / Labs:  All  immunizations are up-to-date. All screenings are up-to-date per recommendations. Obtain CBC, BMET, and lipid profile based on elevated total cholesterol previously.   Overall well exam. Minimal risk factors for cardiovascular/chronic diseases presently. Continue current healthy lifestyle choices. Discussed the importance of Advanced directives. Follow up prevention exam in 1 year and follow up office visit pending lab work.         Other Visit Diagnoses    Hyperlipidemia LDL goal <100        Relevant Orders    Lipid  Profile    CBC    Basic Metabolic Panel (BMET)

## 2015-04-29 NOTE — Patient Instructions (Signed)
Thank you for choosing [facility].  Summary/Instructions:  Please stop by the lab on the basement level of the building for your blood work. Your results will be released to [facility] (or called to you) after review, usually within 72 hours after test completion. If any changes need to be made, you will be notified at that same time.   Health Maintenance  Topic Date Due  . Hepatitis C Screening  1961/08/28  . HIV Screening  07/15/1974  . INFLUENZA VACCINE  04/28/2015  . COLONOSCOPY  11/16/2016  . MAMMOGRAM  04/08/2017  . PAP SMEAR  03/10/2018  . TETANUS/TDAP  06/26/2022     Health Maintenance Adopting a healthy lifestyle and getting preventive care can go a long way to promote health and wellness. Talk with your health care provider about what schedule of regular examinations is right for you. This is a good chance for you to check in with your provider about disease prevention and staying healthy. In between checkups, there are plenty of things you can do on your own. Experts have done a lot of research about which lifestyle changes and preventive measures are most likely to keep you healthy. Ask your health care provider for more information. WEIGHT AND DIET  Eat a healthy diet  Be sure to include plenty of vegetables, fruits, low-fat dairy products, and lean protein.  Do not eat a lot of foods high in solid fats, added sugars, or salt.  Get regular exercise. This is one of the most important things you can do for your health.  Most adults should exercise for at least 150 minutes each week. The exercise should increase your heart rate and make you sweat (moderate-intensity exercise).  Most adults should also do strengthening exercises at least twice a week. This is in addition to the moderate-intensity exercise.  Maintain a healthy weight  Body mass index (BMI) is a measurement that can be used to identify possible weight problems. It estimates body fat based on height  and weight. Your health care provider can help determine your BMI and help you achieve or maintain a healthy weight.  For females 41 years of age and older:   A BMI below 18.5 is considered underweight.  A BMI of 18.5 to 24.9 is normal.  A BMI of 25 to 29.9 is considered overweight.  A BMI of 30 and above is considered obese.  Watch levels of cholesterol and blood lipids  You should start having your blood tested for lipids and cholesterol at 56 years of age, then have this test every 5 years.  You may need to have your cholesterol levels checked more often if:  Your lipid or cholesterol levels are high.  You are older than 56 years of age.  You are at high risk for heart disease.  CANCER SCREENING   Lung Cancer  Lung cancer screening is recommended for adults 32-73 years old who are at high risk for lung cancer because of a history of smoking.  A yearly low-dose CT scan of the lungs is recommended for people who:  Currently smoke.  Have quit within the past 15 years.  Have at least a 30-pack-year history of smoking. A pack year is smoking an average of one pack of cigarettes a day for 1 year.  Yearly screening should continue until it has been 15 years since you quit.  Yearly screening should stop if you develop a health problem that would prevent you from having lung cancer treatment.  Breast  Cancer  Practice breast self-awareness. This means understanding how your breasts normally appear and feel.  It also means doing regular breast self-exams. Let your health care provider know about any changes, no matter how small.  If you are in your 20s or 30s, you should have a clinical breast exam (CBE) by a health care provider every 1-3 years as part of a regular health exam.  If you are 44 or older, have a CBE every year. Also consider having a breast X-ray (mammogram) every year.  If you have a family history of breast cancer, talk to your health care provider about  genetic screening.  If you are at high risk for breast cancer, talk to your health care provider about having an MRI and a mammogram every year.  Breast cancer gene (BRCA) assessment is recommended for women who have family members with BRCA-related cancers. BRCA-related cancers include:  Breast.  Ovarian.  Tubal.  Peritoneal cancers.  Results of the assessment will determine the need for genetic counseling and BRCA1 and BRCA2 testing. Cervical Cancer Routine pelvic examinations to screen for cervical cancer are no longer recommended for nonpregnant women who are considered low risk for cancer of the pelvic organs (ovaries, uterus, and vagina) and who do not have symptoms. A pelvic examination may be necessary if you have symptoms including those associated with pelvic infections. Ask your health care provider if a screening pelvic exam is right for you.   The Pap test is the screening test for cervical cancer for women who are considered at risk.  If you had a hysterectomy for a problem that was not cancer or a condition that could lead to cancer, then you no longer need Pap tests.  If you are older than 65 years, and you have had normal Pap tests for the past 10 years, you no longer need to have Pap tests.  If you have had past treatment for cervical cancer or a condition that could lead to cancer, you need Pap tests and screening for cancer for at least 20 years after your treatment.  If you no longer get a Pap test, assess your risk factors if they change (such as having a new sexual partner). This can affect whether you should start being screened again.  Some women have medical problems that increase their chance of getting cervical cancer. If this is the case for you, your health care provider may recommend more frequent screening and Pap tests.  The human papillomavirus (HPV) test is another test that may be used for cervical cancer screening. The HPV test looks for the virus  that can cause cell changes in the cervix. The cells collected during the Pap test can be tested for HPV.  The HPV test can be used to screen women 69 years of age and older. Getting tested for HPV can extend the interval between normal Pap tests from three to five years.  An HPV test also should be used to screen women of any age who have unclear Pap test results.  After 56 years of age, women should have HPV testing as often as Pap tests.  Colorectal Cancer  This type of cancer can be detected and often prevented.  Routine colorectal cancer screening usually begins at 56 years of age and continues through 56 years of age.  Your health care provider may recommend screening at an earlier age if you have risk factors for colon cancer.  Your health care provider may also recommend using home  test kits to check for hidden blood in the stool.  A small camera at the end of a tube can be used to examine your colon directly (sigmoidoscopy or colonoscopy). This is done to check for the earliest forms of colorectal cancer.  Routine screening usually begins at age 55.  Direct examination of the colon should be repeated every 5-10 years through 56 years of age. However, you may need to be screened more often if early forms of precancerous polyps or small growths are found. Skin Cancer  Check your skin from head to toe regularly.  Tell your health care provider about any new moles or changes in moles, especially if there is a change in a mole's shape or color.  Also tell your health care provider if you have a mole that is larger than the size of a pencil eraser.  Always use sunscreen. Apply sunscreen liberally and repeatedly throughout the day.  Protect yourself by wearing long sleeves, pants, a wide-brimmed hat, and sunglasses whenever you are outside. HEART DISEASE, DIABETES, AND HIGH BLOOD PRESSURE   Have your blood pressure checked at least every 1-2 years. High blood pressure causes  heart disease and increases the risk of stroke.  If you are between 73 years and 55 years old, ask your health care provider if you should take aspirin to prevent strokes.  Have regular diabetes screenings. This involves taking a blood sample to check your fasting blood sugar level.  If you are at a normal weight and have a low risk for diabetes, have this test once every three years after 56 years of age.  If you are overweight and have a high risk for diabetes, consider being tested at a younger age or more often. PREVENTING INFECTION  Hepatitis B  If you have a higher risk for hepatitis B, you should be screened for this virus. You are considered at high risk for hepatitis B if:  You were born in a country where hepatitis B is common. Ask your health care provider which countries are considered high risk.  Your parents were born in a high-risk country, and you have not been immunized against hepatitis B (hepatitis B vaccine).  You have HIV or AIDS.  You use needles to inject street drugs.  You live with someone who has hepatitis B.  You have had sex with someone who has hepatitis B.  You get hemodialysis treatment.  You take certain medicines for conditions, including cancer, organ transplantation, and autoimmune conditions. Hepatitis C  Blood testing is recommended for:  Everyone born from [birth year] through 1967.  Anyone with known risk factors for hepatitis C. Sexually transmitted infections (STIs)  You should be screened for sexually transmitted infections (STIs) including gonorrhea and chlamydia if:  You are sexually active and are younger than 56 years of age.  You are older than 56 years of age and your health care provider tells you that you are at risk for this type of infection.  Your sexual activity has changed since you were last screened and you are at an increased risk for chlamydia or gonorrhea. Ask your health care provider if you are at risk.  If you do not  have HIV, but are at risk, it may be recommended that you take a prescription medicine daily to prevent HIV infection. This is called pre-exposure prophylaxis (PrEP). You are considered at risk if:  You are sexually active and do not regularly use condoms or know the HIV status of your partner(s).  You take drugs by injection.  You are sexually active with a partner who has HIV. Talk with your health care provider about whether you are at high risk of being infected with HIV. If you choose to begin PrEP, you should first be tested for HIV. You should then be tested every 3 months for as long as you are taking PrEP.  PREGNANCY   If you are premenopausal and you may become pregnant, ask your health care provider about preconception counseling.  If you may become pregnant, take 400 to 800 micrograms (mcg) of folic acid every day.  If you want to prevent pregnancy, talk to your health care provider about birth control (contraception). OSTEOPOROSIS AND MENOPAUSE   Osteoporosis is a disease in which the bones lose minerals and strength with aging. This can result in serious bone fractures. Your risk for osteoporosis can be identified using a bone density scan.  If you are 59 years of age or older, or if you are at risk for osteoporosis and fractures, ask your health care provider if you should be screened.  Ask your health care provider whether you should take a calcium or vitamin D supplement to lower your risk for osteoporosis.  Menopause may have certain physical symptoms and risks.  Hormone replacement therapy may reduce some of these symptoms and risks. Talk to your health care provider about whether hormone replacement therapy is right for you.  HOME CARE INSTRUCTIONS   Schedule regular health, dental, and eye exams.  Stay current with your immunizations.   Do not use any tobacco products including cigarettes, chewing tobacco, or electronic cigarettes.  If you are pregnant, do not  drink alcohol.  If you are breastfeeding, limit how much and how often you drink alcohol.  Limit alcohol intake to no more than 1 drink per day for nonpregnant women. One drink equals 12 ounces of beer, 5 ounces of wine, or 1 ounces of hard liquor.  Do not use street drugs.  Do not share needles.  Ask your health care provider for help if you need support or information about quitting drugs.  Tell your health care provider if you often feel depressed.  Tell your health care provider if you have ever been abused or do not feel safe at home. Document Released: 03/29/2011 Document Revised: 01/28/2014 Document Reviewed: 08/15/2013 [facility] Patient Information 2015 [place], [place]. This information is not intended to replace advice given to you by your health care provider. Make sure you discuss any questions you have with your health care provider.  Advance Directive Advance directives are the legal documents that allow you to make choices about your health care and medical treatment if you cannot speak for yourself. Advance directives are a way for you to communicate your wishes to family, friends, and health care providers. The specified people can then convey your decisions about end-of-life care to avoid confusion if you should become unable to communicate. Ideally, the process of discussing and writing advance directives should happen over time rather than making decisions all at once. Advance directives can be modified as your situation changes, and you can change your mind at any time, even after you have signed the advance directives. Each state has its own laws regarding advance directives. You may want to check with your health care provider, attorney, or state representative about the law in your state. Below are some examples of advance directives. LIVING WILL A living will is a set of instructions documenting your wishes about medical  care when you cannot care for yourself. It is  used if you become:  Terminally ill.  Incapacitated.  Unable to communicate.  Unable to make decisions. Items to consider in your living will include:  The use or non-use of life-sustaining equipment, such as dialysis machines and breathing machines (ventilators).  A do not resuscitate (DNR) order, which is the instruction not to use cardiopulmonary resuscitation (CPR) if breathing or heartbeat stops.  Tube feeding.  Withholding of food and fluids.  Comfort (palliative) care when the goal becomes comfort rather than a cure.  Organ and tissue donation. A living will does not give instructions about distribution of your money and property if you should pass away. It is advisable to seek the expert advice of a lawyer in drawing up a will regarding your possessions. Decisions about taxes, beneficiaries, and asset distribution will be legally binding. This process can relieve your family and friends of any burdens surrounding disputes or questions that may come up about the allocation of your assets. DO NOT RESUSCITATE (DNR) A do not resuscitate (DNR) order is a request to not have CPR in the event that your heart stops beating or you stop breathing. Unless given other instructions, a health care provider will try to help any patient whose heart has stopped or who has stopped breathing.  HEALTH CARE PROXY AND DURABLE POWER OF ATTORNEY FOR HEALTH CARE A health care proxy is a person (agent) appointed to make medical decisions for you if you cannot. Generally, people choose someone they know well and trust to represent their preferences when they can no longer do so. You should be sure to ask this person for agreement to act as your agent. An agent may have to exercise judgment in the event of a medical decision for which your wishes are not known. The durable power of attorney for health care is the legal document that names your health care proxy. Once written, it should  be:  Signed.  Notarized.  Dated.  Copied.  Witnessed.  Incorporated into your medical record. You may also want to appoint someone to manage your financial affairs if you cannot. This is called a durable power of attorney for finances. It is a separate legal document from the durable power of attorney for health care. You may choose the same person or someone different from your health care proxy to act as your agent in financial matters. Document Released: 12/21/2007 Document Revised: 09/18/2013 Document Reviewed: 01/31/2013 [facility] Patient Information 2015 [place], [place]. This information is not intended to replace advice given to you by your health care provider. Make sure you discuss any questions you have with your health care provider.  Calcium; Vitamin D chewable tablet What is this medicine? CALCIUM; VITAMIN D (KAL see um; VYE ta min D) is a vitamin supplement. It is used to prevent conditions of low calcium and vitamin D. This medicine may be used for other purposes; ask your health care provider or pharmacist if you have questions. COMMON BRAND NAME(S): Calcet Citrate Soft Chew, Caltrate 600+D, Caltrate Gummy Bites, Citracal Calcium, Citracal Creamy Bites, Os-Cal 500 Plus D, OSCAL with Vitamin D3, Oysco 500 + D What should I tell my health care provider before I take this medicine? They need to know if you have any of these conditions: -constipation -dehydration -heart disease -high level of calcium or vitamin D in the blood -high level of phosphate in the blood -kidney disease -kidney stones -liver disease -parathyroid disease -sarcoidosis -stomach ulcer or  obstruction -an unusual or allergic reaction to calcium, vitamin D, tartrazine dye, other medicines, foods, dyes, or preservatives -pregnant or trying to get pregnant -breast-feeding How should I use this medicine? Take this medicine by mouth. Chew it completely before swallowing. Follow the directions on the  prescription label. Take with food or within 1 hour after a meal. Take your medicine at regular intervals. Do not take your medicine more often than directed. Talk to your pediatrician regarding the use of this medicine in children. While this medicine may be used in children for selected conditions, precautions do apply. Overdosage: If you think you have taken too much of this medicine contact a poison control center or emergency room at once. NOTE: This medicine is only for you. Do not share this medicine with others. What if I miss a dose? If you miss a dose, take it as soon as you can. If it is almost time for your next dose, take only that dose. Do not take double or extra doses. What may interact with this medicine? Do not take this medicine with any of the following medications: -ammonium chloride -methenamine This medicine may also interact with the following medications: -antibiotics like ciprofloxacin, gatifloxacin, tetracycline -captopril -delavirdine -diuretics -gabapentin -iron supplements -medicines for fungal infections like ketoconazole and itraconazole -medicines for seizures like ethotoin and phenytoin -mineral oil -mycophenolate -other vitamins with calcium, vitamin D, or minerals -quinidine -rosuvastatin -sucralfate -thyroid medicine This list may not describe all possible interactions. Give your health care provider a list of all the medicines, herbs, non-prescription drugs, or dietary supplements you use. Also tell them if you smoke, drink alcohol, or use illegal drugs. Some items may interact with your medicine. What should I watch for while using this medicine? Taking this medicine is not a substitute for a well-balanced diet and exercise. Talk with your doctor or health care provider and follow a healthy lifestyle. Do not take this medicine with high-fiber foods, large amounts of alcohol, or drinks containing caffeine. Do not take this medicine within 2 hours of  any other medicines. What side effects may I notice from receiving this medicine? Side effects that you should report to your doctor or health care professional as soon as possible: -allergic reactions like skin rash, itching or hives, swelling of the face, lips, or tongue -confusion -dry mouth -high blood pressure -increased hunger or thirst -increased urination -irregular heartbeat -metallic taste -muscle or bone pain -pain when urinating -seizure -unusually weak or tired -weight loss Side effects that usually do not require medical attention (report to your doctor or health care professional if they continue or are bothersome): -constipation -diarrhea -headache -loss of appetite -nausea, vomiting -stomach upset This list may not describe all possible side effects. Call your doctor for medical advice about side effects. You may report side effects to FDA at 1-800-FDA-1088. Where should I keep my medicine? Keep out of the reach of children. Store at room temperature between 15 and 30 degrees C (59 and 86 degrees F). Protect from light. Keep container tightly closed. Throw away any unused medicine after the expiration date. NOTE: This sheet is a summary. It may not cover all possible information. If you have questions about this medicine, talk to your doctor, pharmacist, or health care provider.  2015, [employer]. (2007-12-27 17:57:27)

## 2015-04-30 NOTE — Telephone Encounter (Signed)
Advised patient of greg calones note, also mailed lab results

## 2015-06-12 DIAGNOSIS — J069 Acute upper respiratory infection, unspecified: Secondary | ICD-10-CM | POA: Diagnosis not present

## 2016-01-08 ENCOUNTER — Ambulatory Visit: Payer: Medicare Other | Admitting: Family

## 2016-01-12 ENCOUNTER — Other Ambulatory Visit (INDEPENDENT_AMBULATORY_CARE_PROVIDER_SITE_OTHER): Payer: Medicare Other

## 2016-01-12 ENCOUNTER — Ambulatory Visit (INDEPENDENT_AMBULATORY_CARE_PROVIDER_SITE_OTHER): Payer: Medicare Other | Admitting: Family

## 2016-01-12 ENCOUNTER — Encounter: Payer: Self-pay | Admitting: Family

## 2016-01-12 VITALS — BP 118/78 | HR 67 | Temp 98.4°F | Resp 16 | Ht 69.0 in | Wt 180.0 lb

## 2016-01-12 DIAGNOSIS — E785 Hyperlipidemia, unspecified: Secondary | ICD-10-CM | POA: Diagnosis not present

## 2016-01-12 DIAGNOSIS — R1010 Upper abdominal pain, unspecified: Secondary | ICD-10-CM | POA: Diagnosis not present

## 2016-01-12 DIAGNOSIS — K921 Melena: Secondary | ICD-10-CM

## 2016-01-12 LAB — LIPASE: Lipase: 5 U/L — ABNORMAL LOW (ref 11.0–59.0)

## 2016-01-12 LAB — CBC
HCT: 35.4 % — ABNORMAL LOW (ref 36.0–46.0)
Hemoglobin: 11.7 g/dL — ABNORMAL LOW (ref 12.0–15.0)
MCHC: 33.1 g/dL (ref 30.0–36.0)
MCV: 86.8 fl (ref 78.0–100.0)
Platelets: 204 10*3/uL (ref 150.0–400.0)
RBC: 4.09 Mil/uL (ref 3.87–5.11)
RDW: 14.9 % (ref 11.5–15.5)
WBC: 3.3 10*3/uL — ABNORMAL LOW (ref 4.0–10.5)

## 2016-01-12 LAB — LIPID PANEL
Cholesterol: 224 mg/dL — ABNORMAL HIGH (ref 0–200)
HDL: 55.8 mg/dL (ref >39.00)
LDL Cholesterol: 156 mg/dL — ABNORMAL HIGH (ref 0–99)
NonHDL: 167.85
Total CHOL/HDL Ratio: 4
Triglycerides: 58 mg/dL (ref 0.0–149.0)
VLDL: 11.6 mg/dL (ref 0.0–40.0)

## 2016-01-12 MED ORDER — PANTOPRAZOLE SODIUM 40 MG PO TBEC: 40.0000 mg | DELAYED_RELEASE_TABLET | Freq: Two times a day (BID) | ORAL | Status: DC

## 2016-01-12 NOTE — Assessment & Plan Note (Signed)
Vital signs are stable with only occasional blood in her stool. Obtain CBC to check hemoglobin. Obtain Hemoccult bloods cards. Continue to monitor at this time pending blood work.

## 2016-01-12 NOTE — Patient Instructions (Addendum)
Thank you for choosing [facility].  Summary/Instructions:  Your prescription(s) have been submitted to your pharmacy or been printed and provided for you. Please take as directed and contact our office if you believe you are having problem(s) with the medication(s) or have any questions.  Please stop by the lab on the basement level of the building for your blood work. Your results will be released to [facility] (or called to you) after review, usually within 72 hours after test completion. If any changes need to be made, you will be notified at that same time.  If your symptoms worsen or fail to improve, please contact our office for further instruction, or in case of emergency go directly to the emergency room at the closest medical facility.    Food Choices for Peptic Ulcer Disease When you have peptic ulcer disease, the foods you eat and your eating habits are very important. Choosing the right foods can help ease the discomfort of peptic ulcer disease. WHAT GENERAL GUIDELINES DO I NEED TO FOLLOW?  Choose fruits, vegetables, whole grains, and low-fat meat, fish, and poultry.   Keep a food diary to identify foods that cause symptoms.  Avoid foods that cause irritation or pain. These may be different for different people.  Eat frequent small meals instead of three large meals each day. The pain may be worse when your stomach is empty.  Avoid eating close to bedtime. WHAT FOODS ARE NOT RECOMMENDED? The following are some foods and drinks that may worsen your symptoms:  Black, white, and red pepper.  Hot sauce.  Chili peppers.  Chili powder.  Chocolate and cocoa.   Alcohol.  Tea, coffee, and cola (regular and decaffeinated). The items listed above may not be a complete list of foods and beverages to avoid. Contact your dietitian for more information.   This information is not intended to replace advice given to you by your health care provider. Make sure you discuss  any questions you have with your health care provider.   Document Released: 12/06/2011 Document Revised: 09/18/2013 Document Reviewed: 07/18/2013 Elsevier Interactive Patient Education 2016 Elsevier Inc.  Peptic Ulcer A peptic ulcer is a sore in the lining of your esophagus (esophageal ulcer), stomach (gastric ulcer), or in the first part of your small intestine (duodenal ulcer). The ulcer causes erosion into the deeper tissue. CAUSES  Normally, the lining of the stomach and the small intestine protects itself from the acid that digests food. The protective lining can be damaged by:  An infection caused by a bacterium called Helicobacter pylori (H. pylori).  Regular use of nonsteroidal anti-inflammatory drugs (NSAIDs), such as ibuprofen or aspirin.  Smoking tobacco. Other risk factors include being older than 88, drinking alcohol excessively, and having a family history of ulcer disease.  SYMPTOMS   Burning pain or gnawing in the area between the chest and the belly button.  Heartburn.  Nausea and vomiting.  Bloating. The pain can be worse on an empty stomach and at night. If the ulcer results in bleeding, it can cause:  Black, tarry stools.  Vomiting of bright red blood.  Vomiting of coffee-ground-looking materials. DIAGNOSIS  A diagnosis is usually made based upon your history and an exam. Other tests and procedures may be performed to find the cause of the ulcer. Finding a cause will help determine the best treatment. Tests and procedures may include:  Blood tests, stool tests, or breath tests to check for the bacterium H. pylori.  An upper gastrointestinal (GI)  series of the esophagus, stomach, and small intestine.  An endoscopy to examine the esophagus, stomach, and small intestine.  A biopsy. TREATMENT  Treatment may include:  Eliminating the cause of the ulcer, such as smoking, NSAIDs, or alcohol.  Medicines to reduce the amount of acid in your digestive  tract.  Antibiotic medicines if the ulcer is caused by the H. pylori bacterium.  An upper endoscopy to treat a bleeding ulcer.  Surgery if the bleeding is severe or if the ulcer created a hole somewhere in the digestive system. HOME CARE INSTRUCTIONS   Avoid tobacco, alcohol, and caffeine. Smoking can increase the acid in the stomach, and continued smoking will impair the healing of ulcers.  Avoid foods and drinks that seem to cause discomfort or aggravate your ulcer.  Only take medicines as directed by your caregiver. Do not substitute over-the-counter medicines for prescription medicines without talking to your caregiver.  Keep any follow-up appointments and tests as directed. SEEK MEDICAL CARE IF:   Your do not improve within 7 days of starting treatment.  You have ongoing indigestion or heartburn. SEEK IMMEDIATE MEDICAL CARE IF:   You have sudden, sharp, or persistent abdominal pain.  You have bloody or dark black, tarry stools.  You vomit blood or vomit that looks like coffee grounds.  You become light-headed, weak, or feel faint.  You become sweaty or clammy. MAKE SURE YOU:   Understand these instructions.  Will watch your condition.  Will get help right away if you are not doing well or get worse.   This information is not intended to replace advice given to you by your health care provider. Make sure you discuss any questions you have with your health care provider.   Document Released: 09/10/2000 Document Revised: 10/04/2014 Document Reviewed: 04/12/2012 Elsevier Interactive Patient Education [employer].

## 2016-01-12 NOTE — Assessment & Plan Note (Addendum)
Symptoms and exam consistent with peptic ulcer disease with concern for possible GI bleed. Obtain lipase, CBC, and H. Pylori. Start pantoprazole. Follow-up and additional treatment pending blood work results.

## 2016-01-12 NOTE — Progress Notes (Signed)
Pre visit review using our clinic review tool, if applicable. No additional management support is needed unless otherwise documented below in the visit note. 

## 2016-01-12 NOTE — Progress Notes (Signed)
Subjective:    Patient ID: Courtney Larsen, female    DOB: 05/08/1961, 55 y.o.   MRN: [id number]  Chief Complaint  Patient presents with  . Adominal pain    states that she has been having this burning pain in her stomach and has been having some blood in stool and sometimes in the water from time to time, this has been going on for a while and she states she tries to eat healthy    HPI:  Courtney Larsen is a 55 y.o. female who  has a past medical history of MI, old (2004); Family history of bleeding or clotting disorder; Cyst of breast (06/26/2012); Personal history of colonic polyp - sessile serrated polyp (11/20/2013); Diverticulosis; Allergy; Anemia; and Helicobacter pylori gastritis (02/05/2014). and presents today for an acute office visit.   This is a new problem. Associated symptom of pain located in the upper half and across her stomach has been going on for a couple of months . Pain is described as burning. Modifying factors include changing her foods and eating well which has not helped very much.  Denies nausea, vomiting, constipation or diarrhea. Stool color is described as green. Endorses some blood in her stool and in the toilet from time to time. No fevers. Previously diagnosed with H. Pylori gastritis via EGD. Aggravating on occasion with eating. No dizziness or lightheadedness. Occasional NSAID use, but not daily.  No Known Allergies   No current outpatient prescriptions on file prior to visit.   No current facility-administered medications on file prior to visit.     Past Surgical History  Procedure Laterality Date  . Back surgery  2011    LOW BACK  . Cesarean section  1988  . Anterior cervical decomp/discectomy fusion  07/06/2012    Procedure: ANTERIOR CERVICAL DECOMPRESSION/DISCECTOMY FUSION 1 LEVEL;  Surgeon: Winfield Cunas, MD;  Location: [facility];  Service: Neurosurgery;  Laterality: N/A;  Cervical six-seven anterior cervical decompression with fusion  plating and bonegraft  . Colonoscopy  11/16/2013    with snare polypectomy    Past Medical History  Diagnosis Date  . MI, old 2004    "stress induced" per pt  . Family history of bleeding or clotting disorder     ?Von Willebrand Disease: Patient, sister and mother   . Cyst of breast 06/26/2012    left - resolved on f/u per pt  . Personal history of colonic polyp - sessile serrated polyp 11/20/2013  . Diverticulosis   . Allergy     SEASONAL  . Anemia   . Helicobacter pylori gastritis 02/05/2014    EGD 01/2014     Review of Systems  Constitutional: Negative for fever and chills.  Gastrointestinal: Positive for abdominal pain and blood in stool. Negative for nausea, vomiting, diarrhea and constipation.  Neurological: Negative for dizziness and light-headedness.      Objective:    BP 118/78 mmHg  Pulse 67  Temp(Src) 98.4 F (36.9 C) (Oral)  Resp 16  Ht 5\' 9"  (1.753 m)  Wt 180 lb (81.647 kg)  BMI 26.57 kg/m2  SpO2 97%  LMP 06/26/2010 Nursing note and vital signs reviewed.  Physical Exam  Constitutional: She is oriented to person, place, and time. She appears well-developed and well-nourished. No distress.  Cardiovascular: Normal rate, regular rhythm, normal heart sounds and intact distal pulses.   Pulmonary/Chest: Effort normal and breath sounds normal.  Abdominal: Normal appearance and bowel sounds are normal. She exhibits no mass. There is no  hepatosplenomegaly. There is tenderness in the right upper quadrant, epigastric area and left upper quadrant. There is no rigidity, no rebound, no guarding, no tenderness at McBurney's point and negative Murphy's sign.  Neurological: She is alert and oriented to person, place, and time.  Skin: Skin is warm and dry.  Psychiatric: She has a normal mood and affect. Her behavior is normal. Judgment and thought content normal.       Assessment & Plan:   Problem List Items Addressed This Visit      Other   Upper abdominal pain -  Primary    Symptoms and exam consistent with peptic ulcer disease with concern for possible GI bleed. Obtain lipase, CBC, and H. Pylori. Start pantoprazole. Follow-up and additional treatment pending blood work results.      Relevant Medications   pantoprazole (PROTONIX) 40 MG tablet   Other Relevant Orders   H Pylori, IGM, IGG, IGA AB   CBC   POC Hemoccult Bld/Stl (3-Cd Home Screen)   Lipase   Blood in stool    Vital signs are stable with only occasional blood in her stool. Obtain CBC to check hemoglobin. Obtain Hemoccult bloods cards. Continue to monitor at this time pending blood work.      Relevant Medications   pantoprazole (PROTONIX) 40 MG tablet   Other Relevant Orders   H Pylori, IGM, IGG, IGA AB   CBC   POC Hemoccult Bld/Stl (3-Cd Home Screen)    Other Visit Diagnoses    Hyperlipidemia LDL goal <100        Relevant Orders    Lipid Profile        I am having Courtney Larsen start on pantoprazole.   Meds ordered this encounter  Medications  . pantoprazole (PROTONIX) 40 MG tablet    Sig: Take 1 tablet (40 mg total) by mouth 2 (two) times daily.    Dispense:  60 tablet    Refill:  0    Order Specific Question:  Supervising Provider    Answer:  Pricilla Holm A [id number]     Follow-up: Return for Pending results.  Mauricio Po, FNP

## 2016-01-14 ENCOUNTER — Telehealth: Payer: Self-pay | Admitting: Family

## 2016-01-14 LAB — H PYLORI, IGM, IGG, IGA AB
H Pylori IgG: 1.6 U/mL — ABNORMAL HIGH (ref 0.0–0.8)
H pylori, IgM Abs: <9 units (ref 0.0–8.9)
H. pylori, IgA Abs: <9 units (ref 0.0–8.9)

## 2016-01-14 NOTE — Telephone Encounter (Signed)
Please inform patient that her H. Pylori was negative for any current infection. Therefore please continue to take the pantoprazole. Her white/red blood cells look okay as well. Her cholesterol is slightly elevated with an LDL or bad cholesterol is 153 with a goal of <100. Calculating your risk for coronary artery disease there is no indication for medication. Recommend lifestyle changes including physical activity and eating a diet that is low in saturated fats and sugary/processed foods.

## 2016-01-16 NOTE — Telephone Encounter (Signed)
LVM for pt to call back.

## 2016-01-23 NOTE — Telephone Encounter (Signed)
Results have been sent in the mail 

## 2016-01-30 ENCOUNTER — Other Ambulatory Visit: Payer: Medicare Other

## 2016-02-02 ENCOUNTER — Telehealth: Payer: Self-pay | Admitting: Family

## 2016-02-02 ENCOUNTER — Other Ambulatory Visit (INDEPENDENT_AMBULATORY_CARE_PROVIDER_SITE_OTHER): Payer: Medicare Other

## 2016-02-02 DIAGNOSIS — K921 Melena: Secondary | ICD-10-CM | POA: Diagnosis not present

## 2016-02-02 DIAGNOSIS — R1010 Upper abdominal pain, unspecified: Secondary | ICD-10-CM

## 2016-02-02 DIAGNOSIS — R1011 Right upper quadrant pain: Principal | ICD-10-CM

## 2016-02-02 DIAGNOSIS — G8929 Other chronic pain: Secondary | ICD-10-CM

## 2016-02-02 LAB — HEMOCCULT SLIDES (X 3 CARDS)
Fecal Occult Blood: NEGATIVE
OCCULT 1: NEGATIVE
OCCULT 2: NEGATIVE
OCCULT 3: NEGATIVE
OCCULT 4: NEGATIVE
OCCULT 5: NEGATIVE

## 2016-02-02 NOTE — Telephone Encounter (Signed)
Please inform patient there is no blood in her stool indicating that it is unlikely that she has a gastrointestinal bleed.

## 2016-02-03 NOTE — Telephone Encounter (Signed)
Pt aware of results 

## 2016-03-16 ENCOUNTER — Ambulatory Visit: Payer: Medicare Other | Admitting: Emergency Medicine

## 2016-03-17 ENCOUNTER — Ambulatory Visit: Payer: Medicare Other | Admitting: Emergency Medicine

## 2016-03-18 ENCOUNTER — Ambulatory Visit (INDEPENDENT_AMBULATORY_CARE_PROVIDER_SITE_OTHER): Payer: Medicare Other | Admitting: Emergency Medicine

## 2016-03-18 ENCOUNTER — Encounter: Payer: Self-pay | Admitting: Emergency Medicine

## 2016-03-18 VITALS — BP 128/68 | HR 71 | Ht 69.0 in | Wt 181.0 lb

## 2016-03-18 DIAGNOSIS — J4520 Mild intermittent asthma, uncomplicated: Secondary | ICD-10-CM | POA: Diagnosis not present

## 2016-03-18 NOTE — Patient Instructions (Signed)
Please stop or decrease your dairy intake.  Be careful using apple vinegar - it could make stomach acid harder to manage We will start fluticasone nasal spray, 2 sprays each side daily  If your cough, mucous and asthma symptoms continue after a month on the fluticasone, then would consider adding omeprazole (Prilosec) 20mg  daily to see if this helps.  Take albuterol 2 puffs up to every 4 hours if needed for shortness of breath.

## 2016-03-18 NOTE — Progress Notes (Signed)
Subjective:    Patient ID: Courtney Larsen, female    DOB: 05/28/1961, 55 y.o.   MRN: [id number]  HPI 55 yo never smoker, hx of allergies and PND, anemia. No childhood asthma. She presents today to eval intermittent dyspnea that started in her 30's. She has noticed that humidity, some fumes and spices, smoke. She develops chest tightness, has trouble talking. She can take albuterol and feel better. She coughs and can bring up clear mucous. She has used been using Primatene for the last week because she ran out of albuterol.   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   ROV 04/04/14 -- follows for suspected asthma. PFT done 6/26 > moderately severe AFL with a BD response. She has freq cough, has tried to use albuterol at these times without much effect. She is blowing her nose.    ROV 05/24/14 -- follow up visit for asthma and chronic cough with allergies.  Last time we started nexium, loratadine, fluticasone. She discovered mold in her house that had to be cleaned out. Her cough is improved.  She has albuterol available, uses SABA occasionally. She Took Symbicort but not sure she benefited. She is finished with nexium, loratadine, fluticasone.   ROV 03/18/16 -- patient has a history of chronic cough, allergies, asthma (FEV1 57% predicted, June 2015).  She reports that she had been doing well for over a year. Noted beginning of this year more throat congestion, more cough. She is waking up at night with throat congestion, some dyspnea. She is using her SABA more frequently, now averaging about 2-3x a day. She notes that she has increased her her dairy, wonders if this is a factor. The worsening sx seem to correspond w the increased dairy. She has intermittent GERD sx.    Review of Systems  Constitutional: Negative for fever and unexpected weight change.  HENT: Positive for congestion. Negative for dental problem, ear pain, nosebleeds, postnasal drip, rhinorrhea, sinus pressure,  sneezing, sore throat and trouble swallowing.   Eyes: Negative for redness and itching.  Respiratory: Positive for cough, choking and shortness of breath. Negative for chest tightness and wheezing.   Cardiovascular: Negative for palpitations and leg swelling.  Gastrointestinal: Negative for nausea and vomiting.  Genitourinary: Negative for dysuria.  Musculoskeletal: Negative for joint swelling.  Skin: Negative for rash.  Neurological: Negative for headaches.  Hematological: Does not bruise/bleed easily.  Psychiatric/Behavioral: Negative for dysphoric mood. The patient is not nervous/anxious.        Objective:   Physical Exam Filed Vitals:   03/18/16 1113  BP: 128/68  Pulse: 71  Height: 5\' 9"  (1.753 m)  Weight: 181 lb (82.101 kg)  SpO2: 98%   Gen: Pleasant, well-nourished, in no distress,  normal affect  ENT: No lesions,  mouth clear,  oropharynx clear, no postnasal drip  Neck: No JVD, no TMG, no carotid bruits  Lungs: No use of accessory muscles, distant, clear without rales or rhonchi  Cardiovascular: RRR, heart sounds normal, no murmur or gallops, no peripheral edema  Musculoskeletal: No deformities, no cyanosis or clubbing  Neuro: alert, non focal  Skin: Warm, no lesions or rashes      Assessment & Plan:  Asthma, mild intermittent With worsening control over the last several months. Exacerbating  factors include worsening rhinitis. Consider possible effects of increased dairy in her diet that may contribute to allergies as well as dyspepsia. She denies any significant GERD but I'm concerned that she may have  silent reflux. Also note that she is using vinegar to settle her stomach which could actually worsen acid symptoms. I like to try and treat her underlying contributors before considering adding daily scheduled medication. She may need an ICS at some point in the future. Continue albuterol when necessary.   Baltazar Apo, MD, PhD 03/18/2016, 11:37 AM [facility] [phone] or if no answer [phone]

## 2016-03-18 NOTE — Assessment & Plan Note (Signed)
With worsening control over the last several months. Exacerbating  factors include worsening rhinitis. Consider possible effects of increased dairy in her diet that may contribute to allergies as well as dyspepsia. She denies any significant GERD but I'm concerned that she may have silent reflux. Also note that she is using vinegar to settle her stomach which could actually worsen acid symptoms. I like to try and treat her underlying contributors before considering adding daily scheduled medication. She may need an ICS at some point in the future. Continue albuterol when necessary.

## 2016-03-22 ENCOUNTER — Telehealth: Payer: Self-pay | Admitting: Emergency Medicine

## 2016-03-22 MED ORDER — FLUTICASONE PROPIONATE 50 MCG/ACT NA SUSP: 2.0000 | Freq: Every day | NASAL | Status: DC

## 2016-03-22 MED ORDER — OMEPRAZOLE 20 MG PO CPDR: 20.0000 mg | DELAYED_RELEASE_CAPSULE | Freq: Every day | ORAL | Status: DC

## 2016-03-22 MED ORDER — ALBUTEROL SULFATE HFA 108 (90 BASE) MCG/ACT IN AERS: 1.0000 | INHALATION_SPRAY | Freq: Four times a day (QID) | RESPIRATORY_TRACT | Status: DC | PRN

## 2016-03-22 NOTE — Telephone Encounter (Signed)
Spoke with the pt and apologized for not sending meds in the day of her ov  I have sent rxs in and nothing further needed

## 2016-04-09 ENCOUNTER — Other Ambulatory Visit: Payer: Self-pay | Admitting: Gynecology

## 2016-04-09 DIAGNOSIS — Z1231 Encounter for screening mammogram for malignant neoplasm of breast: Secondary | ICD-10-CM

## 2016-04-12 DIAGNOSIS — S60939A Unspecified superficial injury of unspecified thumb, initial encounter: Secondary | ICD-10-CM | POA: Diagnosis not present

## 2016-04-20 ENCOUNTER — Ambulatory Visit
Admission: RE | Admit: 2016-04-20 | Discharge: 2016-04-20 | Disposition: A | Discharge status: Home / Self Care | Payer: Medicare Other | Source: Ambulatory Visit | Attending: Gynecology | Admitting: Gynecology

## 2016-04-20 DIAGNOSIS — Z1231 Encounter for screening mammogram for malignant neoplasm of breast: Secondary | ICD-10-CM

## 2016-04-29 ENCOUNTER — Ambulatory Visit (INDEPENDENT_AMBULATORY_CARE_PROVIDER_SITE_OTHER): Payer: Medicare Other | Admitting: Podiatry

## 2016-04-29 ENCOUNTER — Encounter: Payer: Self-pay | Admitting: Podiatry

## 2016-04-29 VITALS — BP 126/73 | HR 72 | Resp 16 | Ht 69.0 in | Wt 180.0 lb

## 2016-04-29 DIAGNOSIS — M779 Enthesopathy, unspecified: Secondary | ICD-10-CM

## 2016-04-29 DIAGNOSIS — B351 Tinea unguium: Secondary | ICD-10-CM | POA: Diagnosis not present

## 2016-04-29 DIAGNOSIS — L608 Other nail disorders: Secondary | ICD-10-CM | POA: Diagnosis not present

## 2016-04-29 NOTE — Progress Notes (Signed)
Subjective:     Patient ID: Courtney Larsen, female   DOB: 1961/03/04, 55 y.o.   MRN: [id number]  HPI patient presents stating I have nail disease bilateral and I've noticed it recently and I'm worried that this could be something systemic or something that will not go away   Review of Systems  All other systems reviewed and are negative.      Objective:   Physical Exam  Constitutional: She is oriented to person, place, and time.  Cardiovascular: Intact distal pulses.   Musculoskeletal: Normal range of motion.  Neurological: She is oriented to person, place, and time.  Skin: Skin is warm.  Nursing note and vitals reviewed.  neurovascular status is found to be intact with muscle strength adequate range of motion within normal limits with patient noted to have yellow discoloration hallux nails bilateral second nails that is in the distal two thirds of the nailbed with slight dark discoloration on the medial lateral sides of the hallux nails. Patient's found have good digital perfusion and is well oriented 3     Assessment:     Mycotic nail infection possible bilateral versus traumatic event    Plan:     H&P condition reviewed and treatment options discussed. At this point using sterile instrumentation and sterile procedure I did biopsy of the hallux nails and I'm sending note to the lab for analysis and we will discuss results and approximate 4 weeks. Patient understands that this may or may not be treatable

## 2016-04-29 NOTE — Progress Notes (Signed)
   Subjective:    Patient ID: Courtney Larsen, female    DOB: [birth year], 55 y.o.   MRN: [id number]  HPI Chief Complaint  Patient presents with  . Nail Problem    Bilateral; great toes; Right foot, 2nd toe; pt stated, "After got a pedicure a month ago, noticed discoloration around the bed of the nails; needs checked for nail fungus"      Review of Systems  All other systems reviewed and are negative.      Objective:   Physical Exam        Assessment & Plan:

## 2016-05-27 ENCOUNTER — Ambulatory Visit (INDEPENDENT_AMBULATORY_CARE_PROVIDER_SITE_OTHER): Payer: Medicare Other | Admitting: Podiatry

## 2016-05-27 ENCOUNTER — Encounter: Payer: Self-pay | Admitting: Podiatry

## 2016-05-27 DIAGNOSIS — B351 Tinea unguium: Secondary | ICD-10-CM | POA: Diagnosis not present

## 2016-05-27 DIAGNOSIS — M779 Enthesopathy, unspecified: Secondary | ICD-10-CM

## 2016-05-27 MED ORDER — FLUCONAZOLE 150 MG PO TABS: 150.0000 mg | ORAL_TABLET | Freq: Once | ORAL | 0 refills | Status: AC

## 2016-05-27 NOTE — Progress Notes (Signed)
Subjective:     Patient ID: Courtney Larsen, female   DOB: 1961/08/06, 56 y.o.   MRN: [id number]  HPI patient presents to review her lab work results of both feet   Review of Systems     Objective:   Physical Exam Neurovascular status intact with patient found have thick nail disease incurvation of the bed and damaged hallux nails bilateral second nail left    Assessment:     Traumatized nail formation with mild mycotic component    Plan:     Reviewed condition and recommended Diflucan as it is sensitive to this along with topical medicine with consideration long-term for laser depending on response. Reappoint 6 months allowing these to grow out and we'll make a decision on what else may be of help

## 2016-06-24 ENCOUNTER — Encounter: Payer: Self-pay | Admitting: Emergency Medicine

## 2016-06-24 ENCOUNTER — Ambulatory Visit (INDEPENDENT_AMBULATORY_CARE_PROVIDER_SITE_OTHER): Payer: Medicare Other | Admitting: Emergency Medicine

## 2016-06-24 DIAGNOSIS — R059 Cough, unspecified: Secondary | ICD-10-CM

## 2016-06-24 DIAGNOSIS — R05 Cough: Secondary | ICD-10-CM

## 2016-06-24 DIAGNOSIS — J4520 Mild intermittent asthma, uncomplicated: Secondary | ICD-10-CM

## 2016-06-24 MED ORDER — ALBUTEROL SULFATE HFA 108 (90 BASE) MCG/ACT IN AERS: 2.0000 | INHALATION_SPRAY | RESPIRATORY_TRACT | 11 refills | Status: DC | PRN

## 2016-06-24 NOTE — Assessment & Plan Note (Signed)
Continue albuterol as needed. She prefers the pro-air to any other formulation and we will try to get this for her. Offered her flu shot and she declined.

## 2016-06-24 NOTE — Patient Instructions (Addendum)
Please continue to limit dairy, avoid vinegar.  Keep pro-air available to use if needed for breathing difficulty.  Follow with Dr Lamonte Sakai in 12 months or sooner if you have any problems

## 2016-06-24 NOTE — Addendum Note (Signed)
Addended by: Desmond Dike C on: 06/24/2016 12:01 PM   Modules accepted: Orders

## 2016-06-24 NOTE — Progress Notes (Signed)
Subjective:    Patient ID: Courtney Larsen, female    DOB: [birth year], 55 y.o.   MRN: [id number]  Asthma  She complains of cough and shortness of breath. There is no wheezing. Pertinent negatives include no ear pain, fever, headaches, postnasal drip, rhinorrhea, sneezing, sore throat or trouble swallowing. Her past medical history is significant for asthma.   55 yo never smoker, hx of allergies and PND, anemia. No childhood asthma. She presents today to eval intermittent dyspnea that started in her 30's. She has noticed that humidity, some fumes and spices, smoke. She develops chest tightness, has trouble talking. She can take albuterol and feel better. She coughs and can bring up clear mucous. She has used been using Primatene for the last week because she ran out of albuterol.   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   ROV 04/04/14 -- follows for suspected asthma. PFT done 6/26 > moderately severe AFL with a BD response. She has freq cough, has tried to use albuterol at these times without much effect. She is blowing her nose.    ROV 05/24/14 -- follow up visit for asthma and chronic cough with allergies.  Last time we started nexium, loratadine, fluticasone. She discovered mold in her house that had to be cleaned out. Her cough is improved.  She has albuterol available, uses SABA occasionally. She Took Symbicort but not sure she benefited. She is finished with nexium, loratadine, fluticasone.   ROV 03/18/16 -- patient has a history of chronic cough, allergies, asthma (FEV1 57% predicted, June 2015).  She reports that she had been doing well for over a year. Noted beginning of this year more throat congestion, more cough. She is waking up at night with throat congestion, some dyspnea. She is using her SABA more frequently, now averaging about 2-3x a day. She notes that she has increased her her dairy, wonders if this is a factor. The worsening sx seem to correspond w the  increased dairy. She has intermittent GERD sx.   ROV 06/24/16 -- this is a follow-up visit for history of asthma in a never-smoker. She has chronic cough w allergies. She was having worsening control of her asthma at our last visit. We tried to modify her diet in case reflux was a contributor. Her cougfh has improved, as has her breathing. She did not have to add omeprazole, she did not start flonase. She has proAir, prefers it to ventolin or proventil. Takes it rarely.    Review of Systems  Constitutional: Negative for fever and unexpected weight change.  HENT: Positive for congestion. Negative for dental problem, ear pain, nosebleeds, postnasal drip, rhinorrhea, sinus pressure, sneezing, sore throat and trouble swallowing.   Eyes: Negative for redness and itching.  Respiratory: Positive for cough, choking and shortness of breath. Negative for chest tightness and wheezing.   Cardiovascular: Negative for palpitations and leg swelling.  Gastrointestinal: Negative for nausea and vomiting.  Genitourinary: Negative for dysuria.  Musculoskeletal: Negative for joint swelling.  Skin: Negative for rash.  Neurological: Negative for headaches.  Hematological: Does not bruise/bleed easily.  Psychiatric/Behavioral: Negative for dysphoric mood. The patient is not nervous/anxious.        Objective:   Physical Exam Vitals:   06/24/16 1146  BP: 116/64  BP Location: Left Arm  Cuff Size: Normal  Pulse: 82  SpO2: 98%  Weight: 181 lb (82.1 kg)  Height: 5\' 9"  (1.753 m)   Gen: Pleasant, well-nourished, in no distress,  normal affect  ENT: No lesions,  mouth clear,  oropharynx clear, no postnasal drip  Neck: No JVD, no TMG, no carotid bruits  Lungs: No use of accessory muscles, distant, clear without rales or rhonchi  Cardiovascular: RRR, heart sounds normal, no murmur or gallops, no peripheral edema  Musculoskeletal: No deformities, no cyanosis or clubbing  Neuro: alert, non focal  Skin:  Warm, no lesions or rashes      Assessment & Plan:  Cough She never started the fluticasone nasal spray. She was able to help her cough by limiting dairy and stopping the vinegar exposure. Cough is well controlled at this time  Asthma, mild intermittent Continue albuterol as needed. She prefers the pro-air to any other formulation and we will try to get this for her. Offered her flu shot and she declined.   Baltazar Apo, MD, PhD 06/24/2016, 12:00 PM [facility] [phone] or if no answer [phone]

## 2016-06-24 NOTE — Assessment & Plan Note (Signed)
She never started the fluticasone nasal spray. She was able to help her cough by limiting dairy and stopping the vinegar exposure. Cough is well controlled at this time

## 2016-08-27 ENCOUNTER — Telehealth: Payer: Self-pay | Admitting: Internal Medicine

## 2016-08-27 NOTE — Telephone Encounter (Signed)
Called Courtney Larsen to schedule awv appt. Left msg for pt to call office to schedule appt.

## 2016-10-28 ENCOUNTER — Ambulatory Visit: Payer: Medicare Other | Admitting: Podiatry

## 2016-11-03 ENCOUNTER — Encounter: Payer: Self-pay | Admitting: Internal Medicine

## 2016-11-29 ENCOUNTER — Telehealth: Payer: Self-pay | Admitting: Family

## 2016-11-29 NOTE — Telephone Encounter (Signed)
Spoke with patient regarding awv with [facility]. Patient stated that she will give office a call to schedule appt.

## 2016-12-20 ENCOUNTER — Encounter: Payer: Self-pay | Admitting: Internal Medicine

## 2017-01-13 ENCOUNTER — Telehealth: Payer: Self-pay | Admitting: Family

## 2017-01-13 ENCOUNTER — Ambulatory Visit (AMBULATORY_SURGERY_CENTER): Payer: Self-pay

## 2017-01-13 VITALS — Ht 69.0 in | Wt 182.3 lb

## 2017-01-13 DIAGNOSIS — Z860100 Personal history of colon polyps, unspecified: Secondary | ICD-10-CM

## 2017-01-13 MED ORDER — ALBUTEROL SULFATE HFA 108 (90 BASE) MCG/ACT IN AERS: 2.0000 | INHALATION_SPRAY | RESPIRATORY_TRACT | 5 refills | Status: DC | PRN

## 2017-01-13 NOTE — Progress Notes (Signed)
No allergies to eggs or soy No diet meds No home oxygen No past problems with anesthesia  Registered emmi 

## 2017-01-13 NOTE — Telephone Encounter (Signed)
Pt has a colonoscopy scheduled for 01/27/17. They asked for her to bring an asthma breathing pump when she came for the procedure. Can a prescription be sent over to [facility] for her? Please advise.

## 2017-01-13 NOTE — Telephone Encounter (Signed)
Rx sent 

## 2017-01-27 ENCOUNTER — Ambulatory Visit (AMBULATORY_SURGERY_CENTER): Payer: Medicare Other | Admitting: Internal Medicine

## 2017-01-27 ENCOUNTER — Encounter: Payer: Self-pay | Admitting: Internal Medicine

## 2017-01-27 VITALS — BP 113/51 | HR 70 | Temp 98.4°F | Resp 16 | Ht 69.0 in | Wt 181.0 lb

## 2017-01-27 DIAGNOSIS — J45909 Unspecified asthma, uncomplicated: Secondary | ICD-10-CM | POA: Diagnosis not present

## 2017-01-27 DIAGNOSIS — Z860100 Personal history of colon polyps, unspecified: Secondary | ICD-10-CM

## 2017-01-27 DIAGNOSIS — K639 Disease of intestine, unspecified: Secondary | ICD-10-CM | POA: Diagnosis not present

## 2017-01-27 DIAGNOSIS — D120 Benign neoplasm of cecum: Secondary | ICD-10-CM

## 2017-01-27 DIAGNOSIS — E669 Obesity, unspecified: Secondary | ICD-10-CM | POA: Diagnosis not present

## 2017-01-27 HISTORY — PX: COLONOSCOPY: SHX174

## 2017-01-27 MED ORDER — SODIUM CHLORIDE 0.9 % IV SOLN: 500.0000 mL | INTRAVENOUS | Status: DC

## 2017-01-27 NOTE — Op Note (Signed)
[place] Patient Name: Courtney Larsen Procedure Date: 01/27/2017 1:44 PM MRN: [id number] Endoscopist: Gatha Mayer , MD Age: 56 Referring MD:  Date of Birth: 03/16/1961 Gender: Female Account #: [id number] Procedure:                Colonoscopy Indications:              High risk colon cancer surveillance: Personal                            history of sessile serrated colon polyp (10 mm or                            greater in size) Medicines:                Propofol per Anesthesia, Monitored Anesthesia Care Procedure:                Pre-Anesthesia Assessment:                           - Prior to the procedure, a History and Physical                            was performed, and patient medications and                            allergies were reviewed. The patient's tolerance of                            previous anesthesia was also reviewed. The risks                            and benefits of the procedure and the sedation                            options and risks were discussed with the patient.                            All questions were answered, and informed consent                            was obtained. Prior Anticoagulants: The patient has                            taken no previous anticoagulant or antiplatelet                            agents. ASA Grade Assessment: II - A patient with                            mild systemic disease. After reviewing the risks                            and benefits, the patient was deemed in  satisfactory condition to undergo the procedure.                           After obtaining informed consent, the colonoscope                            was passed under direct vision. Throughout the                            procedure, the patient's blood pressure, pulse, and                            oxygen saturations were monitored continuously. The                            Model CF-HQ190L  [id number] [id number]) scope was introduced                            through the anus and advanced to the the terminal                            ileum, with identification of the appendiceal                            orifice and IC valve. The colonoscopy was performed                            without difficulty. The patient tolerated the                            procedure well. The quality of the bowel                            preparation was excellent. The terminal ileum,                            ileocecal valve, appendiceal orifice, and rectum                            were photographed. The bowel preparation used was                            Miralax. Scope In: 2:00:52 PM Scope Out: 2:15:10 PM Scope Withdrawal Time: 0 hours 11 minutes 4 seconds  Total Procedure Duration: 0 hours 14 minutes 18 seconds  Findings:                 The perianal and digital rectal examinations were                            normal.                           A diffuse area of mucosa in the terminal ileum and  in the ileocecal valve was nodular. Biopsies were                            taken of the IC valve with a cold forceps for                            histology. Verification of patient identification                            for the specimen was done. Estimated blood loss was                            minimal.                           Multiple small and large-mouthed diverticula were                            found in the sigmoid colon and descending colon.                           The exam was otherwise without abnormality on                            direct and retroflexion views. Complications:            No immediate complications. Estimated Blood Loss:     Estimated blood loss was minimal. Impression:               - Nodular ileal mucosa. Biopsied.                           - Diverticulosis in the sigmoid colon and in the                            descending  colon.                           - The examination was otherwise normal on direct                            and retroflexion views.                           - Personal history of colonic polyp 10 mm sessile                            serrated polyp 2015. Recommendation:           - Patient has a contact number available for                            emergencies. The signs and symptoms of potential                            delayed complications were discussed with the  patient. Return to normal activities tomorrow.                            Written discharge instructions were provided to the                            patient.                           - Resume previous diet.                           - Continue present medications.                           - Repeat colonoscopy is recommended. The                            colonoscopy date will be determined after pathology                            results from today's exam become available for                            review. Gatha Mayer, MD 01/27/2017 2:23:25 PM This report has been signed electronically.

## 2017-01-27 NOTE — Progress Notes (Signed)
Report to PACU, RN, vss, BBS= Clear.  

## 2017-01-27 NOTE — Patient Instructions (Addendum)
I did not find any certain polyps. The valve where small intestine and colon join looked inflamed I think - probably not anything but I took biopsies to see and will let you know.  You do have diverticulosis - thickened muscle rings and pouches in the colon wall. Please read the handout about this condition.  I appreciate the opportunity to care for you. Gatha Mayer, MD, FACG YOU HAD AN ENDOSCOPIC PROCEDURE TODAY AT [facility]:   Refer to the procedure report that was given to you for any specific questions about what was found during the examination.  If the procedure report does not answer your questions, please call your gastroenterologist to clarify.  If you requested that your care partner not be given the details of your procedure findings, then the procedure report has been included in a sealed envelope for you to review at your convenience later.  YOU SHOULD EXPECT: Some feelings of bloating in the abdomen. Passage of more gas than usual.  Walking can help get rid of the air that was put into your GI tract during the procedure and reduce the bloating. If you had a lower endoscopy (such as a colonoscopy or flexible sigmoidoscopy) you may notice spotting of blood in your stool or on the toilet paper. If you underwent a bowel prep for your procedure, you may not have a normal bowel movement for a few days.  Please Note:  You might notice some irritation and congestion in your nose or some drainage.  This is from the oxygen used during your procedure.  There is no need for concern and it should clear up in a day or so.  SYMPTOMS TO REPORT IMMEDIATELY:   Following lower endoscopy (colonoscopy or flexible sigmoidoscopy):  Excessive amounts of blood in the stool  Significant tenderness or worsening of abdominal pains  Swelling of the abdomen that is new, acute  Fever of 100F or higher   For urgent or emergent issues, a gastroenterologist can be reached at any  hour by calling [phone].   DIET:  We do recommend a small meal at first, but then you may proceed to your regular diet.  Drink plenty of fluids but you should avoid alcoholic beverages for 24 hours.  ACTIVITY:  You should plan to take it easy for the rest of today and you should NOT DRIVE or use heavy machinery until tomorrow (because of the sedation medicines used during the test).    FOLLOW UP: Our staff will call the number listed on your records the next business day following your procedure to check on you and address any questions or concerns that you may have regarding the information given to you following your procedure. If we do not reach you, we will leave a message.  However, if you are feeling well and you are not experiencing any problems, there is no need to return our call.  We will assume that you have returned to your regular daily activities without incident.  If any biopsies were taken you will be contacted by phone or by letter within the next 1-3 weeks.  Please call us at [phone] if you have not heard about the biopsies in 3 weeks.   Await for biopsy results to determine next repeat Colonoscopy screening Diverticulosis (handout given)   SIGNATURES/CONFIDENTIALITY: You and/or your care partner have signed paperwork which will be entered into your electronic medical record.  These signatures attest to the fact that that the  information above on your After Visit Summary has been reviewed and is understood.  Full responsibility of the confidentiality of this discharge information lies with you and/or your care-partner.

## 2017-01-27 NOTE — Progress Notes (Signed)
Called to room to assist during endoscopic procedure.  Patient ID and intended procedure confirmed with present staff. Received instructions for my participation in the procedure from the performing physician.  

## 2017-01-28 ENCOUNTER — Telehealth: Payer: Self-pay

## 2017-01-28 NOTE — Telephone Encounter (Signed)
  Follow up Call-  Call back number 01/27/2017  Post procedure Call Back phone  # #[phone] cell, [phone] can leave a message there  Permission to leave phone message Yes  Some recent data might be hidden     Patient questions:  Do you have a fever, pain , or abdominal swelling? No. Pain Score  0 *  Have you tolerated food without any problems? Yes.    Have you been able to return to your normal activities? Yes.    Do you have any questions about your discharge instructions: Diet   No. Medications  No. Follow up visit  No.  Do you have questions or concerns about your Care? No.  Actions: * If pain score is 4 or above: No action needed, pain <4.

## 2017-02-01 ENCOUNTER — Encounter: Payer: Self-pay | Admitting: Internal Medicine

## 2017-02-01 DIAGNOSIS — Z86010 Personal history of colonic polyps: Secondary | ICD-10-CM

## 2017-02-01 NOTE — Progress Notes (Signed)
Benign normal tissue IC valve Colon recall 2023 (Hx ssp 10 mm +)

## 2017-02-09 ENCOUNTER — Encounter: Payer: Self-pay | Admitting: Gynecology

## 2017-04-18 ENCOUNTER — Ambulatory Visit (INDEPENDENT_AMBULATORY_CARE_PROVIDER_SITE_OTHER): Payer: Medicare Other | Admitting: Family

## 2017-04-18 ENCOUNTER — Encounter: Payer: Self-pay | Admitting: Family

## 2017-04-18 ENCOUNTER — Other Ambulatory Visit (INDEPENDENT_AMBULATORY_CARE_PROVIDER_SITE_OTHER): Payer: Medicare Other

## 2017-04-18 VITALS — BP 108/70 | HR 71 | Temp 98.4°F | Resp 16 | Ht 69.0 in | Wt 178.0 lb

## 2017-04-18 DIAGNOSIS — Z7289 Other problems related to lifestyle: Secondary | ICD-10-CM

## 2017-04-18 DIAGNOSIS — H9313 Tinnitus, bilateral: Secondary | ICD-10-CM | POA: Insufficient documentation

## 2017-04-18 DIAGNOSIS — Z0000 Encounter for general adult medical examination without abnormal findings: Secondary | ICD-10-CM

## 2017-04-18 LAB — BASIC METABOLIC PANEL
BUN: 14 mg/dL (ref 6–23)
CO2: 30 mEq/L (ref 19–32)
Calcium: 9.5 mg/dL (ref 8.4–10.5)
Chloride: 102 mEq/L (ref 96–112)
Creatinine, Ser: 0.78 mg/dL (ref 0.40–1.20)
GFR: 97.64 mL/min (ref >60.00)
Glucose, Bld: 89 mg/dL (ref 70–99)
Potassium: 4 mEq/L (ref 3.5–5.1)
Sodium: 138 mEq/L (ref 135–145)

## 2017-04-18 LAB — CBC
HCT: 39.2 % (ref 36.0–46.0)
Hemoglobin: 12.5 g/dL (ref 12.0–15.0)
MCHC: 31.9 g/dL (ref 30.0–36.0)
MCV: 90.3 fl (ref 78.0–100.0)
Platelets: 198 10*3/uL (ref 150.0–400.0)
RBC: 4.34 Mil/uL (ref 3.87–5.11)
RDW: 14.6 % (ref 11.5–15.5)
WBC: 3.3 10*3/uL — ABNORMAL LOW (ref 4.0–10.5)

## 2017-04-18 LAB — LIPID PANEL
Cholesterol: 241 mg/dL — ABNORMAL HIGH (ref 0–200)
HDL: 63.6 mg/dL (ref >39.00)
LDL Cholesterol: 167 mg/dL — ABNORMAL HIGH (ref 0–99)
NonHDL: 177.86
Total CHOL/HDL Ratio: 4
Triglycerides: 56 mg/dL (ref 0.0–149.0)
VLDL: 11.2 mg/dL (ref 0.0–40.0)
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
## 2017-04-18 NOTE — Assessment & Plan Note (Signed)
New-onset tinnitus of bilateral ears with no significant findings on physical exam. Refer to ear nose and throat for further assessment and treatment. Encouraged to avoid loud sounds.

## 2017-04-18 NOTE — Assessment & Plan Note (Signed)
Reviewed and updated patient's medical, surgical, family and social history. Medications and allergies were also reviewed. Basic screenings for depression, activities of daily living, hearing, cognition and safety were performed. Provider list was updated and health plan was provided to the patient.  

## 2017-04-18 NOTE — Patient Instructions (Signed)
Thank you for choosing [facility].  SUMMARY AND INSTRUCTIONS:  We will check your blood work today.  Your physical exam looks good!  They will call with a referral to the Ear, Nose and Throat doctors to check your hearing.   Labs:  Please stop by the lab on the lower level of the building for your blood work. Your results will be released to [place] (or called to you) after review, usually within 72 hours after test completion. If any changes need to be made, you will be notified at that same time.  1.) The lab is open from 7:30am to 5:30 pm Monday-Friday 2.) No appointment is necessary 3.) Fasting (if needed) is 6-8 hours after food and drink; black coffee and water are okay   Follow up:  If your symptoms worsen or fail to improve, please contact our office for further instruction, or in case of emergency go directly to the emergency room at the closest medical facility.   Health Maintenance  Topic Date Due  . Hepatitis C Screening  1961-02-11  . HIV Screening  07/15/1974  . INFLUENZA VACCINE  04/27/2017  . PAP SMEAR  03/10/2018  . MAMMOGRAM  04/20/2018  . COLONOSCOPY  01/27/2022  . TETANUS/TDAP  06/26/2022     Health Maintenance, Female Adopting a healthy lifestyle and getting preventive care can go a long way to promote health and wellness. Talk with your health care provider about what schedule of regular examinations is right for you. This is a good chance for you to check in with your provider about disease prevention and staying healthy. In between checkups, there are plenty of things you can do on your own. Experts have done a lot of research about which lifestyle changes and preventive measures are most likely to keep you healthy. Ask your health care provider for more information. Weight and diet Eat a healthy diet  Be sure to include plenty of vegetables, fruits, low-fat dairy products, and lean protein.  Do not eat a lot of foods high in solid fats, added  sugars, or salt.  Get regular exercise. This is one of the most important things you can do for your health. ? Most adults should exercise for at least 150 minutes each week. The exercise should increase your heart rate and make you sweat (moderate-intensity exercise). ? Most adults should also do strengthening exercises at least twice a week. This is in addition to the moderate-intensity exercise.  Maintain a healthy weight  Body mass index (BMI) is a measurement that can be used to identify possible weight problems. It estimates body fat based on height and weight. Your health care provider can help determine your BMI and help you achieve or maintain a healthy weight.  For females 58 years of age and older: ? A BMI below 18.5 is considered underweight. ? A BMI of 18.5 to 24.9 is normal. ? A BMI of 25 to 29.9 is considered overweight. ? A BMI of 30 and above is considered obese.  Watch levels of cholesterol and blood lipids  You should start having your blood tested for lipids and cholesterol at 58 years of age, then have this test every 5 years.  You may need to have your cholesterol levels checked more often if: ? Your lipid or cholesterol levels are high. ? You are older than 58 years of age. ? You are at high risk for heart disease.  Cancer screening Lung Cancer  Lung cancer screening is recommended for adults  55-80 years old who are at high risk for lung cancer because of a history of smoking.  A yearly low-dose CT scan of the lungs is recommended for people who: ? Currently smoke. ? Have quit within the past 15 years. ? Have at least a 30-pack-year history of smoking. A pack year is smoking an average of one pack of cigarettes a day for 1 year.  Yearly screening should continue until it has been 15 years since you quit.  Yearly screening should stop if you develop a health problem that would prevent you from having lung cancer treatment.  Breast Cancer  Practice breast  self-awareness. This means understanding how your breasts normally appear and feel.  It also means doing regular breast self-exams. Let your health care provider know about any changes, no matter how small.  If you are in your 20s or 30s, you should have a clinical breast exam (CBE) by a health care provider every 1-3 years as part of a regular health exam.  If you are 40 or older, have a CBE every year. Also consider having a breast X-ray (mammogram) every year.  If you have a family history of breast cancer, talk to your health care provider about genetic screening.  If you are at high risk for breast cancer, talk to your health care provider about having an MRI and a mammogram every year.  Breast cancer gene (BRCA) assessment is recommended for women who have family members with BRCA-related cancers. BRCA-related cancers include: ? Breast. ? Ovarian. ? Tubal. ? Peritoneal cancers.  Results of the assessment will determine the need for genetic counseling and BRCA1 and BRCA2 testing.  Cervical Cancer Your health care provider may recommend that you be screened regularly for cancer of the pelvic organs (ovaries, uterus, and vagina). This screening involves a pelvic examination, including checking for microscopic changes to the surface of your cervix (Pap test). You may be encouraged to have this screening done every 3 years, beginning at age 21.  For women ages 30-65, health care providers may recommend pelvic exams and Pap testing every 3 years, or they may recommend the Pap and pelvic exam, combined with testing for human papilloma virus (HPV), every 5 years. Some types of HPV increase your risk of cervical cancer. Testing for HPV may also be done on women of any age with unclear Pap test results.  Other health care providers may not recommend any screening for nonpregnant women who are considered low risk for pelvic cancer and who do not have symptoms. Ask your health care provider if a  screening pelvic exam is right for you.  If you have had past treatment for cervical cancer or a condition that could lead to cancer, you need Pap tests and screening for cancer for at least 20 years after your treatment. If Pap tests have been discontinued, your risk factors (such as having a new sexual partner) need to be reassessed to determine if screening should resume. Some women have medical problems that increase the chance of getting cervical cancer. In these cases, your health care provider may recommend more frequent screening and Pap tests.  Colorectal Cancer  This type of cancer can be detected and often prevented.  Routine colorectal cancer screening usually begins at 58 years of age and continues through 58 years of age.  Your health care provider may recommend screening at an earlier age if you have risk factors for colon cancer.  Your health care provider may also recommend using   home test kits to check for hidden blood in the stool.  A small camera at the end of a tube can be used to examine your colon directly (sigmoidoscopy or colonoscopy). This is done to check for the earliest forms of colorectal cancer.  Routine screening usually begins at age 50.  Direct examination of the colon should be repeated every 5-10 years through 58 years of age. However, you may need to be screened more often if early forms of precancerous polyps or small growths are found.  Skin Cancer  Check your skin from head to toe regularly.  Tell your health care provider about any new moles or changes in moles, especially if there is a change in a mole's shape or color.  Also tell your health care provider if you have a mole that is larger than the size of a pencil eraser.  Always use sunscreen. Apply sunscreen liberally and repeatedly throughout the day.  Protect yourself by wearing long sleeves, pants, a wide-brimmed hat, and sunglasses whenever you are outside.  Heart disease, diabetes, and  high blood pressure  High blood pressure causes heart disease and increases the risk of stroke. High blood pressure is more likely to develop in: ? People who have blood pressure in the high end of the normal range (130-139/85-89 mm Hg). ? People who are overweight or obese. ? People who are African American.  If you are 18-39 years of age, have your blood pressure checked every 3-5 years. If you are 40 years of age or older, have your blood pressure checked every year. You should have your blood pressure measured twice-once when you are at a hospital or clinic, and once when you are not at a hospital or clinic. Record the average of the two measurements. To check your blood pressure when you are not at a hospital or clinic, you can use: ? An automated blood pressure machine at a pharmacy. ? A home blood pressure monitor.  If you are between 55 years and 79 years old, ask your health care provider if you should take aspirin to prevent strokes.  Have regular diabetes screenings. This involves taking a blood sample to check your fasting blood sugar level. ? If you are at a normal weight and have a low risk for diabetes, have this test once every three years after 58 years of age. ? If you are overweight and have a high risk for diabetes, consider being tested at a younger age or more often. Preventing infection Hepatitis B  If you have a higher risk for hepatitis B, you should be screened for this virus. You are considered at high risk for hepatitis B if: ? You were born in a country where hepatitis B is common. Ask your health care provider which countries are considered high risk. ? Your parents were born in a high-risk country, and you have not been immunized against hepatitis B (hepatitis B vaccine). ? You have HIV or AIDS. ? You use needles to inject street drugs. ? You live with someone who has hepatitis B. ? You have had sex with someone who has hepatitis B. ? You get hemodialysis  treatment. ? You take certain medicines for conditions, including cancer, organ transplantation, and autoimmune conditions.  Hepatitis C  Blood testing is recommended for: ? Everyone born from 1945 through 1965. ? Anyone with known risk factors for hepatitis C.  Sexually transmitted infections (STIs)  You should be screened for sexually transmitted infections (STIs) including gonorrhea and   chlamydia if: ? You are sexually active and are younger than 58 years of age. ? You are older than 59 years of age and your health care provider tells you that you are at risk for this type of infection. ? Your sexual activity has changed since you were last screened and you are at an increased risk for chlamydia or gonorrhea. Ask your health care provider if you are at risk.  If you do not have HIV, but are at risk, it may be recommended that you take a prescription medicine daily to prevent HIV infection. This is called pre-exposure prophylaxis (PrEP). You are considered at risk if: ? You are sexually active and do not regularly use condoms or know the HIV status of your partner(s). ? You take drugs by injection. ? You are sexually active with a partner who has HIV.  Talk with your health care provider about whether you are at high risk of being infected with HIV. If you choose to begin PrEP, you should first be tested for HIV. You should then be tested every 3 months for as long as you are taking PrEP. Pregnancy  If you are premenopausal and you may become pregnant, ask your health care provider about preconception counseling.  If you may become pregnant, take 400 to 800 micrograms (mcg) of folic acid every day.  If you want to prevent pregnancy, talk to your health care provider about birth control (contraception). Osteoporosis and menopause  Osteoporosis is a disease in which the bones lose minerals and strength with aging. This can result in serious bone fractures. Your risk for osteoporosis  can be identified using a bone density scan.  If you are 5 years of age or older, or if you are at risk for osteoporosis and fractures, ask your health care provider if you should be screened.  Ask your health care provider whether you should take a calcium or vitamin D supplement to lower your risk for osteoporosis.  Menopause may have certain physical symptoms and risks.  Hormone replacement therapy may reduce some of these symptoms and risks. Talk to your health care provider about whether hormone replacement therapy is right for you. Follow these instructions at home:  Schedule regular health, dental, and eye exams.  Stay current with your immunizations.  Do not use any tobacco products including cigarettes, chewing tobacco, or electronic cigarettes.  If you are pregnant, do not drink alcohol.  If you are breastfeeding, limit how much and how often you drink alcohol.  Limit alcohol intake to no more than 1 drink per day for nonpregnant women. One drink equals 12 ounces of beer, 5 ounces of wine, or 1 ounces of hard liquor.  Do not use street drugs.  Do not share needles.  Ask your health care provider for help if you need support or information about quitting drugs.  Tell your health care provider if you often feel depressed.  Tell your health care provider if you have ever been abused or do not feel safe at home. This information is not intended to replace advice given to you by your health care provider. Make sure you discuss any questions you have with your health care provider. Document Released: 03/29/2011 Document Revised: 02/19/2016 Document Reviewed: 06/17/2015 Elsevier Interactive Patient Education  [employer].

## 2017-04-18 NOTE — Assessment & Plan Note (Signed)
1) Anticipatory Guidance: Discussed importance of wearing a seatbelt while driving and not texting while driving; changing batteries in smoke detector at least once annually; wearing suntan lotion when outside; eating a balanced and moderate diet; getting physical activity at least 30 minutes per day.  2) Immunizations / Screenings / Labs:  All immunizations are up-to-date per recommendations. Due for dental and vision exam encouraged to be completed independently. Breast and cervical cancer screenings are up-to-date per recommendations. Colon cancer screening is up-to-date per recommendations. Obtain hepatitis C antibody for hepatitis C screening. All other screenings are up-to-date per recommendations. Obtain CBC, CMET, and lipid profile.    Overall well exam with minimal risk factors for cardiovascular disease presently. She exercises regularly and eats a balanced and moderate nutritional intake with limited caffeine. Encourage continue healthy lifestyle behaviors and choices. Follow-up prevention exam in 1 year. Follow-up office visit pending blood work and for chronic conditions as needed.
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
## 2017-04-19 LAB — HEPATITIS C ANTIBODY: HCV Ab: NEGATIVE

## 2017-04-19 LAB — HIV ANTIBODY (ROUTINE TESTING W REFLEX): HIV 1&2 Ab, 4th Generation: NONREACTIVE

## 2017-05-19 DIAGNOSIS — H903 Sensorineural hearing loss, bilateral: Secondary | ICD-10-CM | POA: Diagnosis not present

## 2017-05-19 DIAGNOSIS — H9313 Tinnitus, bilateral: Secondary | ICD-10-CM | POA: Diagnosis not present

## 2017-06-28 ENCOUNTER — Ambulatory Visit: Payer: BLUE CROSS/BLUE SHIELD | Admitting: Emergency Medicine

## 2017-08-11 ENCOUNTER — Encounter: Payer: Self-pay | Admitting: Emergency Medicine

## 2017-08-11 ENCOUNTER — Ambulatory Visit (INDEPENDENT_AMBULATORY_CARE_PROVIDER_SITE_OTHER): Payer: Medicare Other | Admitting: Emergency Medicine

## 2017-08-11 DIAGNOSIS — J4520 Mild intermittent asthma, uncomplicated: Secondary | ICD-10-CM | POA: Diagnosis not present

## 2017-08-11 DIAGNOSIS — J309 Allergic rhinitis, unspecified: Secondary | ICD-10-CM | POA: Insufficient documentation

## 2017-08-11 DIAGNOSIS — J301 Allergic rhinitis due to pollen: Secondary | ICD-10-CM | POA: Diagnosis not present

## 2017-08-11 MED ORDER — FLUTICASONE PROPIONATE 50 MCG/ACT NA SUSP: 2.0000 | Freq: Every day | NASAL | 5 refills | Status: DC

## 2017-08-11 NOTE — Assessment & Plan Note (Signed)
Poor control over the last several weeks.  Unclear whether this was a URI or exposure related.  The chronicity suggests that it is allergic in nature.  I believe she should take fluticasone nasal spray for at least a month then change it to as needed.  If this does not give good control then she will call me and we may decide to add other medications or further the workup

## 2017-08-11 NOTE — Patient Instructions (Signed)
You may continue to use mucinex as needed for congestion.  Start fluticasone nasal spray, 2 sprays each nostril daily for the next month. Then you can change to using just as needed for congestion.  Keep albuterol available to use 2 puffs as needed for shortness of breath Flu shot up to date.  Follow with Dr Lamonte Sakai in 12 months or sooner if you have any problems

## 2017-08-11 NOTE — Assessment & Plan Note (Signed)
Increase in her symptoms, albuterol use over the last several weeks that appear to be related to worsening nasal congestion, rhinitis, allergic symptoms.  No wheezing on exam today so no overt exacerbation.  Need to concentrate on treating her allergies more aggressively at least for the next month.  Continue albuterol as needed.  Flu shot up-to-date

## 2017-08-11 NOTE — Progress Notes (Signed)
Subjective:    Patient ID: Courtney Larsen, female    DOB: 07-25-1961, 56 y.o.   MRN: [id number]  Asthma  She complains of cough and shortness of breath. There is no wheezing. Pertinent negatives include no ear pain, fever, headaches, postnasal drip, rhinorrhea, sneezing, sore throat or trouble swallowing. Her past medical history is significant for asthma.  Cough  Associated symptoms include shortness of breath. Pertinent negatives include no ear pain, eye redness, fever, headaches, postnasal drip, rash, rhinorrhea, sore throat or wheezing. Her past medical history is significant for asthma.   56 yo never smoker, hx of allergies and PND, anemia. No childhood asthma. She presents today to eval intermittent dyspnea that started in her 30's. She has noticed that humidity, some fumes and spices, smoke. She develops chest tightness, has trouble talking. She can take albuterol and feel better. She coughs and can bring up clear mucous. She has used been using Primatene for the last week because she ran out of albuterol.   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   ROV 04/04/14 -- follows for suspected asthma. PFT done 6/26 > moderately severe AFL with a BD response. She has freq cough, has tried to use albuterol at these times without much effect. She is blowing her nose.    ROV 05/24/14 -- follow up visit for asthma and chronic cough with allergies.  Last time we started nexium, loratadine, fluticasone. She discovered mold in her house that had to be cleaned out. Her cough is improved.  She has albuterol available, uses SABA occasionally. She Took Symbicort but not sure she benefited. She is finished with nexium, loratadine, fluticasone.   ROV 03/18/16 -- patient has a history of chronic cough, allergies, asthma (FEV1 57% predicted, June 2015).  She reports that she had been doing well for over a year. Noted beginning of this year more throat congestion, more cough. She is waking up  at night with throat congestion, some dyspnea. She is using her SABA more frequently, now averaging about 2-3x a day. She notes that she has increased her her dairy, wonders if this is a factor. The worsening sx seem to correspond w the increased dairy. She has intermittent GERD sx.   ROV 06/24/16 -- this is a follow-up visit for history of asthma in a never-smoker. She has chronic cough w allergies. She was having worsening control of her asthma at our last visit. We tried to modify her diet in case reflux was a contributor. Her cougfh has improved, as has her breathing. She did not have to add omeprazole, she did not start flonase. She has proAir, prefers it to ventolin or proventil. Takes it rarely.   ROV 08/11/17 --patient has a history of asthma and chronic cough.  This in the setting of GERD, suspected allergic rhinitis. She has albuterol available to use prn. For the last month her nasal congestion has increased, has increased her cough. Uses albuterol very rarely. She has cut caffeine out of her diet. She is having a bit more reflux at night. She may have had a sick exposure from family member. No flares since last time, no pred, no abx.    Review of Systems  Constitutional: Negative for fever and unexpected weight change.  HENT: Positive for congestion. Negative for dental problem, ear pain, nosebleeds, postnasal drip, rhinorrhea, sinus pressure, sneezing, sore throat and trouble swallowing.   Eyes: Negative for redness and itching.  Respiratory: Positive for cough, choking and  shortness of breath. Negative for chest tightness and wheezing.   Cardiovascular: Negative for palpitations and leg swelling.  Gastrointestinal: Negative for nausea and vomiting.  Genitourinary: Negative for dysuria.  Musculoskeletal: Negative for joint swelling.  Skin: Negative for rash.  Neurological: Negative for headaches.  Hematological: Does not bruise/bleed easily.  Psychiatric/Behavioral: Negative for  dysphoric mood. The patient is not nervous/anxious.        Objective:   Physical Exam Vitals:   08/11/17 1339  BP: 132/78  Pulse: 82  SpO2: 100%  Weight: 176 lb (79.8 kg)  Height: 5\' 9"  (1.753 m)   Gen: Pleasant, well-nourished, in no distress,  normal affect  ENT: No lesions,  mouth clear,  oropharynx clear, no postnasal drip  Neck: No JVD, no stridor  Lungs: No use of accessory muscles, distant, clear without rales or rhonchi  Cardiovascular: RRR, heart sounds normal, no murmur or gallops, no peripheral edema  Musculoskeletal: No deformities, no cyanosis or clubbing  Neuro: alert, non focal  Skin: Warm, no lesions or rashes      Assessment & Plan:  Asthma, mild intermittent Increase in her symptoms, albuterol use over the last several weeks that appear to be related to worsening nasal congestion, rhinitis, allergic symptoms.  No wheezing on exam today so no overt exacerbation.  Need to concentrate on treating her allergies more aggressively at least for the next month.  Continue albuterol as needed.  Flu shot up-to-date  Allergic rhinitis Poor control over the last several weeks.  Unclear whether this was a URI or exposure related.  The chronicity suggests that it is allergic in nature.  I believe she should take fluticasone nasal spray for at least a month then change it to as needed.  If this does not give good control then she will call me and we may decide to add other medications or further the workup  Baltazar Apo, MD, PhD 08/11/2017, 2:13 PM [facility] [phone] or if no answer [phone]

## 2018-05-11 IMAGING — MG DIGITAL SCREENING BILATERAL MAMMOGRAM WITH CAD
5 series · 5 of 5 positions shown · non-contrast
Comparison: Previous exam(s).

CLINICAL DATA: Screening.

EXAM:
DIGITAL SCREENING BILATERAL MAMMOGRAM WITH CAD

[R MLO (1 of 2)]
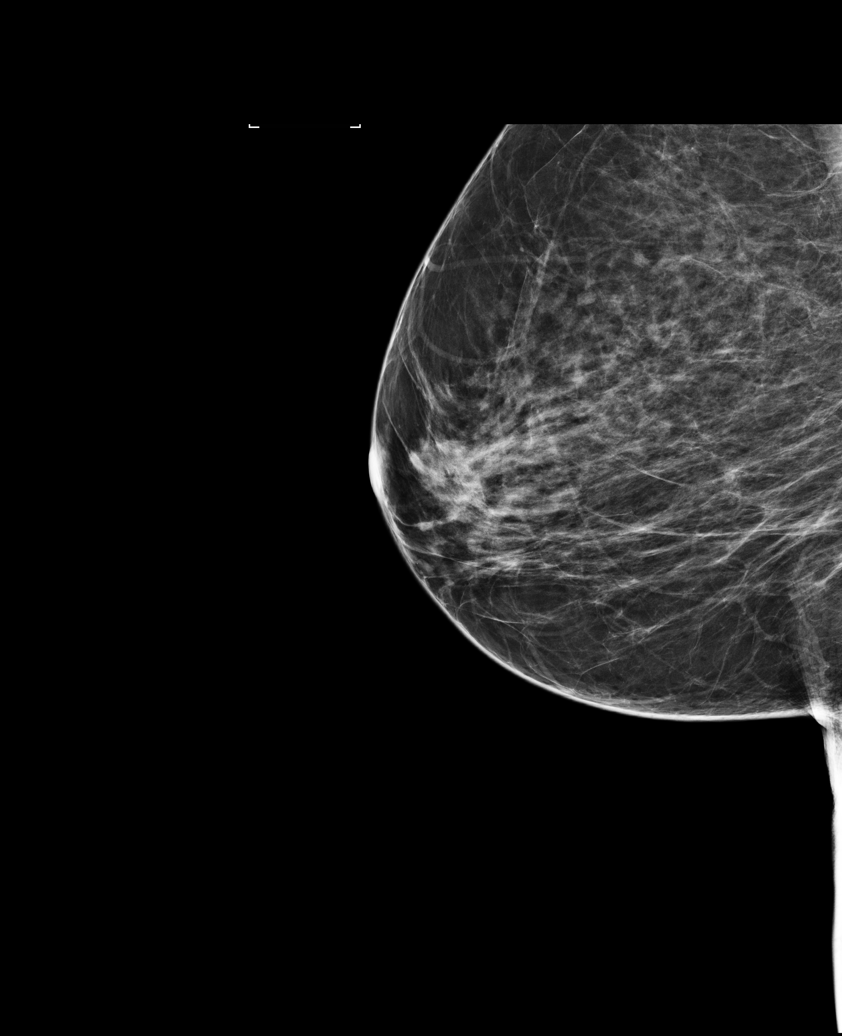

[R CC]
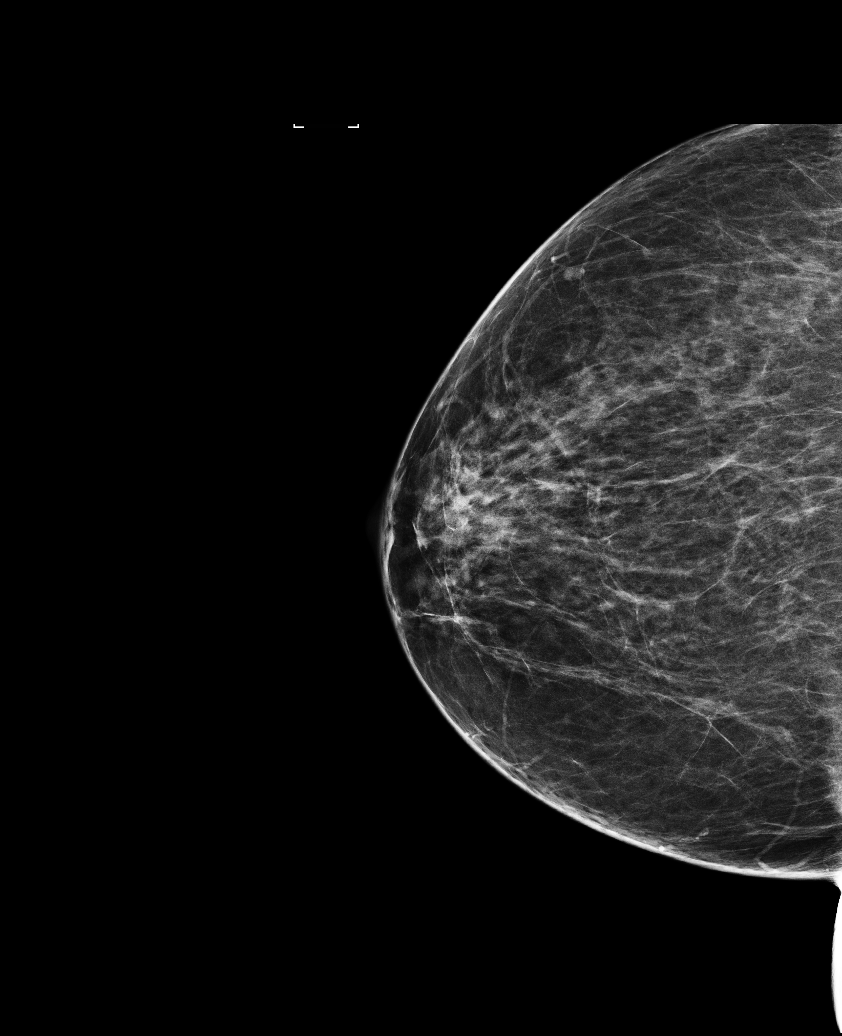

[R MLO (2 of 2)]
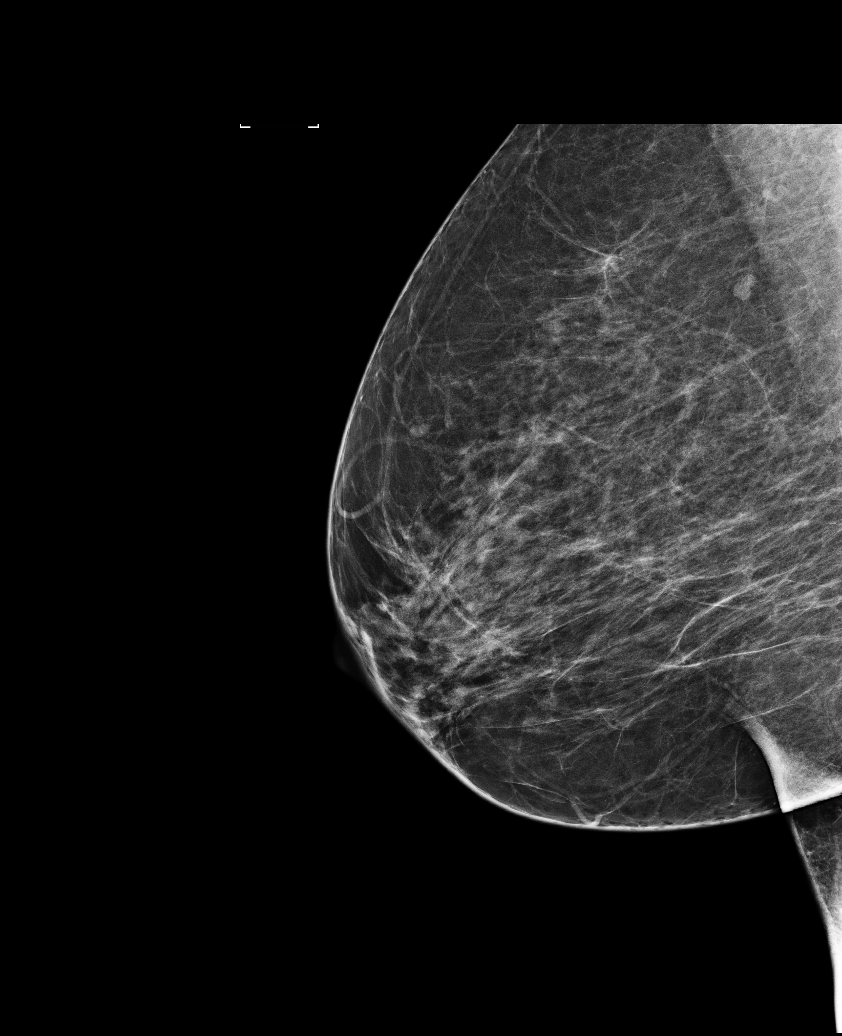

[L MLO]
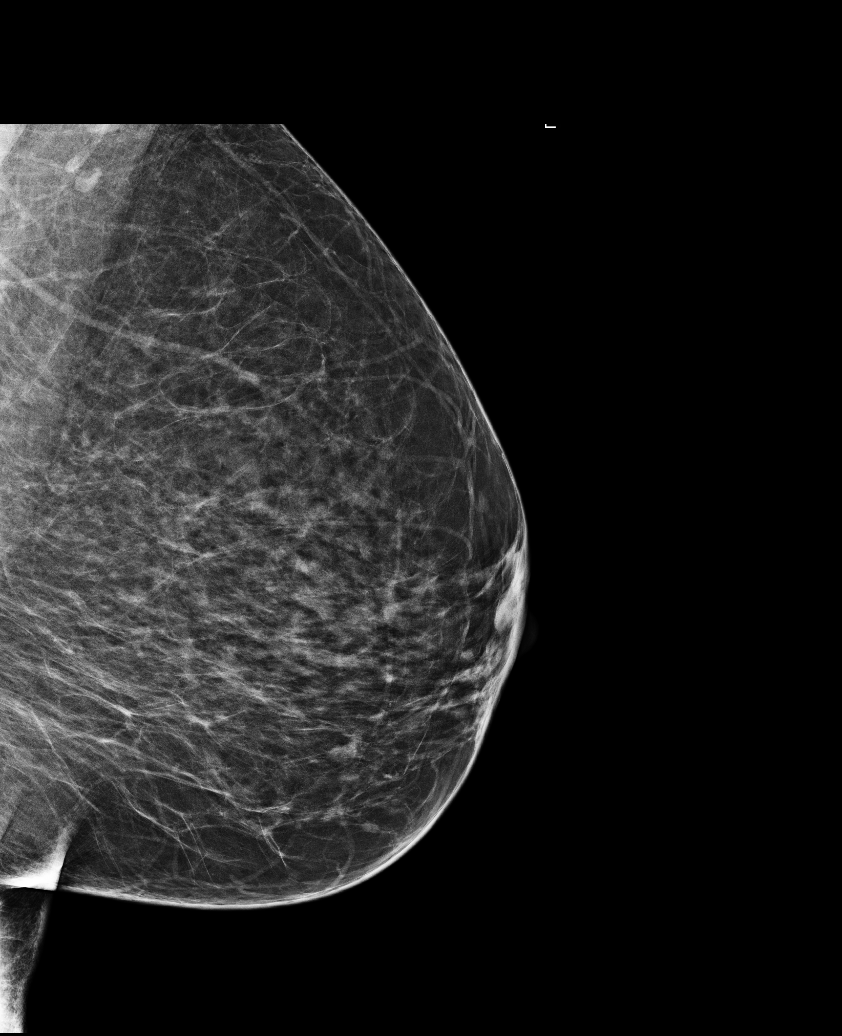

[L CC]
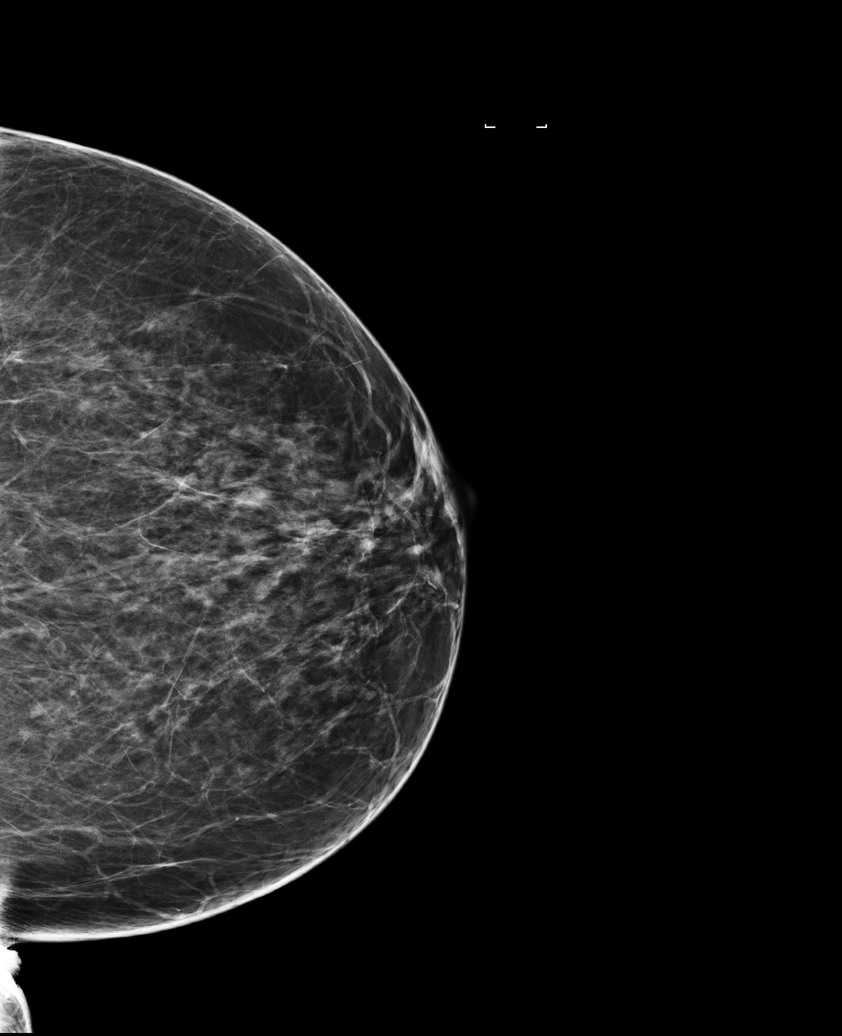

[5 of 5 positions shown; findings below may reference images not displayed]

ACR Breast Density Category b: There are scattered areas of
fibroglandular density.
FINDINGS: There are no findings suspicious for malignancy. Images were
processed with CAD.
IMPRESSION: No mammographic evidence of malignancy. A result letter of this
screening mammogram will be mailed directly to the patient.

RECOMMENDATION:
Screening mammogram in one year. (Code:AS-G-LCT)

BI-RADS CATEGORY  1: Negative.

## 2018-11-14 ENCOUNTER — Telehealth: Payer: Self-pay

## 2018-11-14 NOTE — Telephone Encounter (Signed)
-----   Message from Colon Branch, MD sent at 11/14/2018 12:30 PM EST ----- Regarding: RE: New pt Contact: [phone] Yes, schedule at her convenience JP CC Kaylyn ----- Message ----- From: Rosalin Hawking Sent: 11/14/2018   9:46 AM EST To: Colon Branch, MD Subject: New pt                                         Pt would like to know if possible to be est with Dr Larose Kells as new pt, pt was recommended by her husband who sees Dr Larose Kells Higinio Plan MRN [id number]) Pt Richard was seen today and got the approval from Dr Larose Kells. Just want to confirm.

## 2018-11-14 NOTE — Telephone Encounter (Signed)
Pt already schedule for 12-11-2018 for new pt. Done

## 2018-11-14 NOTE — Telephone Encounter (Signed)
-----   Message from Colon Branch, MD sent at 11/14/2018 12:30 PM EST ----- Regarding: RE: New pt Contact: [phone] Yes, schedule at her convenience JP CC Zinia Innocent ----- Message ----- From: Rosalin Hawking Sent: 11/14/2018   9:46 AM EST To: Colon Branch, MD Subject: New pt                                         Pt would like to know if possible to be est with Dr Larose Kells as new pt, pt was recommended by her husband who sees Dr Larose Kells Higinio Plan MRN [id number]) Pt Richard was seen today and got the approval from Dr Larose Kells. Just want to confirm.

## 2018-11-14 NOTE — Telephone Encounter (Signed)
Okay to schedule NP appt w/ Dr. Larose Kells at [facility] convenience. Please make her aware that Dr. Larose Kells does not perform routine female care such as paps and she will need to see a GYN for that. Thank you.

## 2018-12-11 ENCOUNTER — Ambulatory Visit: Payer: Medicare Other | Admitting: Internal Medicine

## 2018-12-11 ENCOUNTER — Ambulatory Visit: Payer: Medicare Other | Admitting: Medical

## 2018-12-11 ENCOUNTER — Other Ambulatory Visit: Payer: Self-pay

## 2018-12-18 ENCOUNTER — Ambulatory Visit (INDEPENDENT_AMBULATORY_CARE_PROVIDER_SITE_OTHER): Payer: Medicare Other | Admitting: Internal Medicine

## 2018-12-18 ENCOUNTER — Other Ambulatory Visit: Payer: Self-pay

## 2018-12-18 ENCOUNTER — Encounter: Payer: Self-pay | Admitting: Internal Medicine

## 2018-12-18 VITALS — BP 126/68 | HR 72 | Temp 98.1°F | Resp 16 | Ht 70.0 in | Wt 180.5 lb

## 2018-12-18 DIAGNOSIS — Z1231 Encounter for screening mammogram for malignant neoplasm of breast: Secondary | ICD-10-CM

## 2018-12-18 DIAGNOSIS — E785 Hyperlipidemia, unspecified: Secondary | ICD-10-CM

## 2018-12-18 DIAGNOSIS — J4520 Mild intermittent asthma, uncomplicated: Secondary | ICD-10-CM | POA: Diagnosis not present

## 2018-12-18 DIAGNOSIS — R399 Unspecified symptoms and signs involving the genitourinary system: Secondary | ICD-10-CM | POA: Diagnosis not present

## 2018-12-18 DIAGNOSIS — H9313 Tinnitus, bilateral: Secondary | ICD-10-CM | POA: Diagnosis not present

## 2018-12-18 DIAGNOSIS — D649 Anemia, unspecified: Secondary | ICD-10-CM | POA: Diagnosis not present

## 2018-12-18 DIAGNOSIS — Z09 Encounter for follow-up examination after completed treatment for conditions other than malignant neoplasm: Secondary | ICD-10-CM | POA: Insufficient documentation

## 2018-12-18 DIAGNOSIS — Z01419 Encounter for gynecological examination (general) (routine) without abnormal findings: Secondary | ICD-10-CM

## 2018-12-18 LAB — COMPREHENSIVE METABOLIC PANEL
ALT: 12 U/L (ref 0–35)
AST: 14 U/L (ref 0–37)
Albumin: 4.4 g/dL (ref 3.5–5.2)
Alkaline Phosphatase: 71 U/L (ref 39–117)
BUN: 18 mg/dL (ref 6–23)
CO2: 30 mEq/L (ref 19–32)
Calcium: 9.2 mg/dL (ref 8.4–10.5)
Chloride: 102 mEq/L (ref 96–112)
Creatinine, Ser: 0.76 mg/dL (ref 0.40–1.20)
GFR: 94.11 mL/min (ref >60.00)
Glucose, Bld: 82 mg/dL (ref 70–99)
Potassium: 3.9 mEq/L (ref 3.5–5.1)
Sodium: 139 mEq/L (ref 135–145)
Total Bilirubin: 0.5 mg/dL (ref 0.2–1.2)
Total Protein: 7.4 g/dL (ref 6.0–8.3)

## 2018-12-18 LAB — URINALYSIS, ROUTINE W REFLEX MICROSCOPIC
Bilirubin Urine: NEGATIVE
Ketones, ur: NEGATIVE
Nitrite: NEGATIVE
Specific Gravity, Urine: >=1.03 — AB (ref 1.000–1.030)
Total Protein, Urine: NEGATIVE
Urine Glucose: NEGATIVE
Urobilinogen, UA: 0.2 (ref 0.0–1.0)
pH: 5.5 (ref 5.0–8.0)

## 2018-12-18 LAB — CBC WITH DIFFERENTIAL/PLATELET
Basophils Absolute: 0 10*3/uL (ref 0.0–0.1)
Basophils Relative: 0.9 % (ref 0.0–3.0)
Eosinophils Absolute: 0.3 10*3/uL (ref 0.0–0.7)
Eosinophils Relative: 5.3 % — ABNORMAL HIGH (ref 0.0–5.0)
HCT: 37.4 % (ref 36.0–46.0)
Hemoglobin: 12.4 g/dL (ref 12.0–15.0)
Lymphocytes Relative: 32.4 % (ref 12.0–46.0)
Lymphs Abs: 1.6 10*3/uL (ref 0.7–4.0)
MCHC: 33.1 g/dL (ref 30.0–36.0)
MCV: 88.8 fl (ref 78.0–100.0)
Monocytes Absolute: 0.5 10*3/uL (ref 0.1–1.0)
Monocytes Relative: 10.2 % (ref 3.0–12.0)
Neutro Abs: 2.5 10*3/uL (ref 1.4–7.7)
Neutrophils Relative %: 51.2 % (ref 43.0–77.0)
Platelets: 208 10*3/uL (ref 150.0–400.0)
RBC: 4.21 Mil/uL (ref 3.87–5.11)
RDW: 14.9 % (ref 11.5–15.5)
WBC: 4.8 10*3/uL (ref 4.0–10.5)

## 2018-12-18 LAB — LIPID PANEL
Cholesterol: 222 mg/dL — ABNORMAL HIGH (ref 0–200)
HDL: 55.7 mg/dL (ref >39.00)
LDL Cholesterol: 155 mg/dL — ABNORMAL HIGH (ref 0–99)
NonHDL: 166.74
Total CHOL/HDL Ratio: 4
Triglycerides: 59 mg/dL (ref 0.0–149.0)
VLDL: 11.8 mg/dL (ref 0.0–40.0)

## 2018-12-18 LAB — TSH: TSH: 0.78 u[IU]/mL (ref 0.35–4.50)

## 2018-12-18 NOTE — Assessment & Plan Note (Addendum)
Asthma: Currently controlled ;  her observation is that treating her allergies helped significantly controlling her asthma. Allergies: Currently on Flonase H/o high cholesterol: Check CMP, FLP History of anemia, check a CBC and TSH Tinnitus S/p ENT eval 04-2017, see below, ongoing sx, refer back to ENT Courtney Larsen is a 58 y.o. female with type a tympanograms bilaterally and a ski slope high frequency sensorineural hearing loss is equal bilaterally. We discussed the hearing loss. Subjectively she is struggling. She has hearing loss that possibly could benefit from a hearing aid but it is marginal. Hearing a probably would help with the tinnitus which is another advantage. She will consider these options. We discussed tinnitus treatment. She will follow-up for a yearly audiogram unless she decides to schedule with the audiologist.   LUTS: Occasional burning at the lower abdomen without associated GI or GU symptoms.  For completeness we will get a UA, urine culture.  She also needs to see gynecology.  Referral sent. Preventive care reviewed RTC 1 year.

## 2018-12-18 NOTE — Progress Notes (Signed)
Pre visit review using our clinic review tool, if applicable. No additional management support is needed unless otherwise documented below in the visit note. 

## 2018-12-18 NOTE — Progress Notes (Signed)
Subjective:    Patient ID: Courtney Larsen, female    DOB: Jan 23, 1961, 58 y.o.   MRN: [id number]  DOS:  12/18/2018 Type of visit - description: ROV New patient, transferring to this location. Asthma: No symptoms recently Allergies: Well controlled with Flonase History of high cholesterol: Doing well with diet and exercise History of anemia: Need to recheck a CBC We also discussed:  Has approximately 2 years history of tinnitus, was evaluated by ENT 04-2017.  Note reviewed.  Tinnitus continue, has sxs daily, at times intense and is causing difficulties with concentration.  Also, 2 years history of lower abdominal, bilateral "pressure, burning" When she has those symptoms, she does not have associated dysuria, gross hematuria. Denies constipation, diarrhea, blood in the stools.   Review of Systems  Other than above, a 14 point review of systems is negative     Past Medical History:  Diagnosis Date  . Allergy    SEASONAL  . Anemia   . Asthma    allery induced - inhaler rarely used  . Cyst of breast 06/26/2012   left - resolved on f/u per pt  . Diverticulosis   . Family history of bleeding or clotting disorder    ?Von Willebrand Disease: Patient, sister and mother   . Helicobacter pylori gastritis 02/05/2014   EGD 01/2014  . Hyperlipidemia   . MI, old 2004   "stress induced" per pt  . Personal history of colonic polyp - sessile serrated polyp 11/20/2013    Past Surgical History:  Procedure Laterality Date  . ANTERIOR CERVICAL DECOMP/DISCECTOMY FUSION  07/06/2012   Procedure: ANTERIOR CERVICAL DECOMPRESSION/DISCECTOMY FUSION 1 LEVEL;  Surgeon: Winfield Cunas, MD;  Location: [facility];  Service: Neurosurgery;  Laterality: N/A;  Cervical six-seven anterior cervical decompression with fusion plating and bonegraft  . BACK SURGERY  2011   LOW BACK  . CESAREAN SECTION  1988  . COLONOSCOPY     with snare polypectomy  . WISDOM TOOTH EXTRACTION      Social History    Socioeconomic History  . Marital status: Single    Spouse name: Not on file  . Number of children: 3  . Years of education: 46  . Highest education level: Not on file  Occupational History  . Occupation: Retired  Scientific laboratory technician  . Financial resource strain: Not on file  . Food insecurity:    Worry: Not on file    Inability: Not on file  . Transportation needs:    Medical: Not on file    Non-medical: Not on file  Tobacco Use  . Smoking status: Never Smoker  . Smokeless tobacco: Never Used  Substance and Sexual Activity  . Alcohol use: No    Alcohol/week: 0.0 standard drinks  . Drug use: No  . Sexual activity: Yes  Lifestyle  . Physical activity:    Days per week: Not on file    Minutes per session: Not on file  . Stress: Not on file  Relationships  . Social connections:    Talks on phone: Not on file    Gets together: Not on file    Attends religious service: Not on file    Active member of club or organization: Not on file    Attends meetings of clubs or organizations: Not on file    Relationship status: Not on file  . Intimate partner violence:    Fear of current or ex partner: Not on file    Emotionally abused: Not on  file    Physically abused: Not on file    Forced sexual activity: Not on file  Other Topics Concern  . 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   Denies abuse and feels safe at home.      Family History  Adopted: Yes  Problem Relation Age of Onset  . Diabetes Mother   . Ulcers Mother   . Bleeding Disorder Mother   . Early death Mother   . Learning disabilities Mother   . Heart attack Mother   . Cancer Sister 76       BRAIN TUMOR  . Breast cancer Sister 49  . Heart disease Sister   . Bleeding Disorder Sister   . Colon cancer Neg Hx   .  Esophageal cancer Neg Hx   . Pancreatic cancer Neg Hx   . Rectal cancer Neg Hx   . Stomach cancer Neg Hx      Allergies as of 12/18/2018   No Known Allergies     Medication List       Accurate as of December 18, 2018  2:04 PM. Always use your most recent med list.        fluticasone 50 MCG/ACT nasal spray Commonly known as:  FLONASE Place 2 sprays daily into both nostrils.           Objective:   Physical Exam BP 126/68 (BP Location: Left Arm, Patient Position: Sitting, Cuff Size: Small)   Pulse 72   Temp 98.1 F (36.7 C) (Oral)   Resp 16   Ht 5\' 10"  (1.778 m)   Wt 180 lb 8 oz (81.9 kg)   LMP 06/26/2010   SpO2 98%   BMI 25.90 kg/m  General: Well developed, NAD, BMI noted Neck: No  thyromegaly  HEENT:  Normocephalic . Face symmetric, atraumatic Lungs:  CTA B Normal respiratory effort, no intercostal retractions, no accessory muscle use. Heart: RRR,  no murmur.  No pretibial edema bilaterally  Abdomen:  Not distended, soft, non-tender. No rebound or rigidity.   Skin: Exposed areas without rash. Not pale. Not jaundice Neurologic:  alert & oriented X3.  Speech normal, gait appropriate for age and unassisted Strength symmetric and appropriate for age.  Psych: Cognition and judgment appear intact.  Cooperative with normal attention span and concentration.  Behavior appropriate. No anxious or depressed appearing.     Assessment     Assessment Asthma PFTs 02/2014 SPIROMETRY: Severe Airflow Limitation. Significant improvement in airflows after bronchodilator Allergic rhinitis History of asbestos exposure Menopause : age?  EGD 02/01/2014: + H. pylori, treated. Tinnitus  PLAN Asthma: Currently controlled ;  her observation is that treating her allergies helped significantly controlling her asthma. Allergies: Currently on Flonase H/o high cholesterol: Check CMP, FLP History of anemia, check a CBC and TSH Tinnitus S/p ENT eval 04-2017, see below, ongoing sx,  refer back to ENT Courtney Larsen is a 58 y.o. female with type a tympanograms bilaterally and a ski slope high frequency sensorineural hearing loss is equal bilaterally. We discussed the hearing loss. Subjectively she is struggling. She has hearing loss that possibly could benefit from a hearing aid but it is marginal. Hearing a probably would help with the tinnitus which is another advantage.  She will consider these options. We discussed tinnitus treatment. She will follow-up for a yearly audiogram unless she decides to schedule with the audiologist.   LUTS: Occasional burning at the lower abdomen without associated GI or GU symptoms.  For completeness we will get a UA, urine culture.  She also needs to see gynecology.  Referral sent.  Preventive care reviewed RTC 1 year.

## 2018-12-18 NOTE — Assessment & Plan Note (Addendum)
Preventive care reviewed: Td 2013.  PNM 23: 2013. Shingrix discussed CCS: Had a colonoscopy 2015 and 01/27/2017, next 2023 Female care: Refer to gynecology, last visit in 2016 DEXA done @ gyn before? Re assess on RTC Last mammogram 03-2016, schedule a mammogram 2 months from today (due to coronavirus no routine tests are to be scheduled).

## 2018-12-18 NOTE — Patient Instructions (Signed)
GO TO THE LAB : Get the blood work     GO TO THE FRONT DESK Schedule your next appointment for a checkup in 1 year  We are referring you to a gynecologist  While scheduling a mammogram in 2 months  Consider a Shingrix shot

## 2018-12-21 MED ORDER — NITROFURANTOIN MONOHYD MACRO 100 MG PO CAPS: 100.0000 mg | ORAL_CAPSULE | Freq: Two times a day (BID) | ORAL | 0 refills | Status: DC

## 2018-12-21 NOTE — Addendum Note (Signed)
Addended byDamita Dunnings D on: 12/21/2018 01:26 PM   Modules accepted: Orders

## 2018-12-27 ENCOUNTER — Ambulatory Visit: Payer: Medicare Other | Admitting: Obstetrics & Gynecology

## 2019-02-12 ENCOUNTER — Other Ambulatory Visit: Payer: Self-pay

## 2019-02-13 ENCOUNTER — Ambulatory Visit: Payer: Medicare Other | Admitting: Obstetrics & Gynecology

## 2019-03-21 ENCOUNTER — Telehealth: Payer: Self-pay | Admitting: *Deleted

## 2019-03-21 ENCOUNTER — Ambulatory Visit: Payer: Self-pay | Admitting: *Deleted

## 2019-03-21 DIAGNOSIS — Z20822 Contact with and (suspected) exposure to covid-19: Secondary | ICD-10-CM

## 2019-03-21 NOTE — Telephone Encounter (Signed)
Patient and husband scheduled for covid testing 03/22/19 @ [facility] @ 10:00am. Instructions given and order placed.

## 2019-03-21 NOTE — Telephone Encounter (Signed)
Needs virtual visit please.  

## 2019-03-21 NOTE — Telephone Encounter (Signed)
I spoke with the patient's husband, I am testing him for COVID-19 due to exposure at work.  He is completely asymptomatic [place] has chronic mild cough at baseline.  Otherwise no fever chills No nausea, vomiting, diarrhea. Sent a message to the [facility] to be tested for covid

## 2019-03-21 NOTE — Telephone Encounter (Signed)
-----   Message from Colon Branch, MD sent at 03/21/2019 11:20 AM EDT ----- Regarding: Needs COVID testing, likes to do it at the same time with her husband Husband is [employer]

## 2019-03-21 NOTE — Telephone Encounter (Signed)
Pt calling to ask about getting tested for the covid-19. She has a cough that she has had for a while she stated. She denies any other symptom. Lives with her fiance that has been around people that have tested positive for the virus in his job. She was unable to say if he is having symptoms of the virus. She is advised of having a virtual appointment and also also advised of other facilities that are offering testing. She voiced understanding. Will route to the office for review and recommendation for an appointment.  Reason for Disposition . Cough has been present for > 3 weeks  Answer Assessment - Initial Assessment Questions 1. CLOSE CONTACT: "Who is the person with the confirmed or suspected COVID-19 infection that you were exposed to?"     Employees at her fiancee's job 2. PLACE of CONTACT: "Where were you when you were exposed to COVID-19?" (e.g., home, school, medical waiting room; which city?)     At work and home 3. TYPE of CONTACT: "How much contact was there?" (e.g., sitting next to, live in same house, work in same office, same building)     Touching same equipment and standing next to each other 4. DURATION of CONTACT: "How long were you in contact with the COVID-19 patient?" (e.g., a few seconds, passed by person, a few minutes, live with the patient)     All week 5. DATE of CONTACT: "When did you have contact with a COVID-19 patient?" (e.g., how many days ago)     Lives with fiance 6. TRAVEL: "Have you traveled out of the country recently?" If so, "When and where?"     * Also ask about out-of-state travel, since the CDC has identified some high-risk cities for community spread in the [place].     * Note: Travel becomes less relevant if there is widespread community transmission where the patient lives.     No travel 7. COMMUNITY SPREAD: "Are there lots of cases of COVID-19 (community spread) where you live?" (See public health department website, if unsure)       community 8.  SYMPTOMS: "Do you have any symptoms?" (e.g., fever, cough, breathing difficulty)     cough 9. PREGNANCY OR POSTPARTUM: "Is there any chance you are pregnant?" "When was your last menstrual period?" "Did you deliver in the last 2 weeks?"     no 10. HIGH RISK: "Do you have any heart or lung problems? Do you have a weak immune system?" (e.g., CHF, COPD, asthma, HIV positive, chemotherapy, renal failure, diabetes mellitus, sickle cell anemia)       Asthma, abestos  Protocols used: [place], CORONAVIRUS (COVID-19) EXPOSURE-A-AH

## 2019-03-22 ENCOUNTER — Ambulatory Visit: Payer: Medicare Other | Admitting: Obstetrics & Gynecology

## 2019-03-22 ENCOUNTER — Other Ambulatory Visit: Payer: Self-pay

## 2019-03-22 DIAGNOSIS — Z20822 Contact with and (suspected) exposure to covid-19: Secondary | ICD-10-CM

## 2019-03-22 DIAGNOSIS — R6889 Other general symptoms and signs: Secondary | ICD-10-CM | POA: Diagnosis not present

## 2019-03-27 LAB — NOVEL CORONAVIRUS, NAA: SARS-CoV-2, NAA: NOT DETECTED

## 2019-05-10 ENCOUNTER — Telehealth: Payer: Self-pay | Admitting: Emergency Medicine

## 2019-05-10 ENCOUNTER — Other Ambulatory Visit: Payer: Self-pay

## 2019-05-10 ENCOUNTER — Ambulatory Visit (INDEPENDENT_AMBULATORY_CARE_PROVIDER_SITE_OTHER): Payer: PRIVATE HEALTH INSURANCE | Admitting: Obstetrics & Gynecology

## 2019-05-10 ENCOUNTER — Encounter: Payer: Self-pay | Admitting: Obstetrics & Gynecology

## 2019-05-10 VITALS — BP 126/84 | Ht 68.25 in | Wt 176.0 lb

## 2019-05-10 DIAGNOSIS — Z780 Asymptomatic menopausal state: Secondary | ICD-10-CM

## 2019-05-10 DIAGNOSIS — Z01419 Encounter for gynecological examination (general) (routine) without abnormal findings: Secondary | ICD-10-CM | POA: Diagnosis not present

## 2019-05-10 DIAGNOSIS — Z1151 Encounter for screening for human papillomavirus (HPV): Secondary | ICD-10-CM | POA: Diagnosis not present

## 2019-05-10 NOTE — Telephone Encounter (Signed)
Looked at pt's chart and it seems like the last PFT she had performed was back on 03/22/2014.  Pt was made aware of the results of this test back on 03/27/2014 and was told that it showed evidence of severe asthma and that she would need to discuss her inhaled medications next OV to decide if more should be added. Pt did have an OV after that on 04/04/2014.  Pt's last OV with RB was 08/11/2017.      Called and spoke with pt. Pt wanted to further have the PFT from 02/2014 discussed with her as she said that her PCP told her that asbestos had been mentioned. I stated to her the results of the PFT and then went back and looked at her first [place] with RB prior to the PFT and saw where it mentioned possible asbestos exposure from when she was in [place]. Stated to pt that she had a cxr after that OV and stated to her that in the results of that cxr, RB said that he did not see any evidence of asbestos exposure. Pt requested to have all that info mailed to her address so she could have it. Verified pt's mailing address and have placed all info in mail for pt. Nothing further needed.

## 2019-05-10 NOTE — Progress Notes (Signed)
Courtney Larsen 10/14/1960 [id number]   History:    58 y.o. G3P3L3 Stable partner x 15 years  RP:  New (>3 years) patient presenting for annual gyn exam   HPI:  Menopause, well on no HRT.  No PMB.  No pelvic pain.  No pain with IC.  Urine/BMs normal. Breasts normal.  BMI 26.57.  Physically active.  Health labs with Fam MD.  Past medical history,surgical history, family history and social history were all reviewed and documented in the EPIC chart.  Gynecologic History Patient's last menstrual period was 06/26/2010. Contraception: post menopausal status Last Pap: 02/2015. Results were: Negative/HPV HR Neg Last mammogram: 03/2016. Results were: Negative Bone Density: Never Colonoscopy: 2018  Obstetric History OB History  Gravida Para Term Preterm AB Living  3 3 2 1   3   SAB TAB Ectopic Multiple Live Births          3    # Outcome Date GA Lbr Len/2nd Weight Sex Delivery Anes PTL Lv  3 Term     M VBAC, Forcep  N LIV  2 Term     M Vag-Spont  N LIV  1 Preterm     M CS-Unspec  Y LIV     ROS: A ROS was performed and pertinent positives and negatives are included in the history.  GENERAL: No fevers or chills. HEENT: No change in vision, no earache, sore throat or sinus congestion. NECK: No pain or stiffness. CARDIOVASCULAR: No chest pain or pressure. No palpitations. PULMONARY: No shortness of breath, cough or wheeze. GASTROINTESTINAL: No abdominal pain, nausea, vomiting or diarrhea, melena or bright red blood per rectum. GENITOURINARY: No urinary frequency, urgency, hesitancy or dysuria. MUSCULOSKELETAL: No joint or muscle pain, no back pain, no recent trauma. DERMATOLOGIC: No rash, no itching, no lesions. ENDOCRINE: No polyuria, polydipsia, no heat or cold intolerance. No recent change in weight. HEMATOLOGICAL: No anemia or easy bruising or bleeding. NEUROLOGIC: No headache, seizures, numbness, tingling or weakness. PSYCHIATRIC: No depression, no loss of interest in normal activity or  change in sleep pattern.     Exam:   BP 126/84    Ht 5' 8.25" (1.734 m)    Wt 176 lb (79.8 kg)    LMP 06/26/2010    BMI 26.57 kg/m   Body mass index is 26.57 kg/m.  General appearance : Well developed well nourished female. No acute distress HEENT: Eyes: no retinal hemorrhage or exudates,  Neck supple, trachea midline, no carotid bruits, no thyroidmegaly Lungs: Clear to auscultation, no rhonchi or wheezes, or rib retractions  Heart: Regular rate and rhythm, no murmurs or gallops Breast:Examined in sitting and supine position were symmetrical in appearance, no palpable masses or tenderness,  no skin retraction, no nipple inversion, no nipple discharge, no skin discoloration, no axillary or supraclavicular lymphadenopathy Abdomen: no palpable masses or tenderness, no rebound or guarding Extremities: no edema or skin discoloration or tenderness  Pelvic: Vulva: Normal             Vagina: No gross lesions or discharge  Cervix: No gross lesions or discharge.  Pap/HPV done  Uterus  AV, normal size, shape and consistency, non-tender and mobile  Adnexa  Without masses or tenderness  Anus: Normal   Assessment/Plan:  58 y.o. female for annual exam   1. Encounter for gynecological examination with Papanicolaou smear of cervix Normal gynecologic exam.  Pap test with high-risk HPV done today.  Breast exam normal.  Will schedule screening mammogram now.  Colonoscopy  in 2018.  Health labs with family physician. - PAP,TP IMGw/HPV RNA,rflx [id number],06/01  2. Postmenopause Menopause, well on no hormone replacement therapy.  No postmenopausal bleeding.  Recommend vitamin D supplements, calcium intake of 1200 mg daily and regular weightbearing physical activities.  3. Special screening examination for human papillomavirus (HPV) - PAP,TP IMGw/HPV RNA,rflx [id number],27/61  Princess Bruins MD, 11:51 AM 05/10/2019

## 2019-05-10 NOTE — Telephone Encounter (Signed)
LMTCB

## 2019-05-10 NOTE — Telephone Encounter (Signed)
Pt is returning call. Cb is [phone]

## 2019-05-11 LAB — PAP, TP IMAGING W/ HPV RNA, RFLX HPV TYPE 16,18/45: HPV DNA High Risk: NOT DETECTED

## 2019-05-14 ENCOUNTER — Encounter: Payer: Self-pay | Admitting: Obstetrics & Gynecology

## 2019-05-14 NOTE — Patient Instructions (Signed)
1. Encounter for gynecological examination with Papanicolaou smear of cervix Normal gynecologic exam.  Pap test with high-risk HPV done today.  Breast exam normal.  Will schedule screening mammogram now.  Colonoscopy in 2018.  Health labs with family physician. - PAP,TP IMGw/HPV RNA,rflx [id number],38/93  2. Postmenopause Menopause, well on no hormone replacement therapy.  No postmenopausal bleeding.  Recommend vitamin D supplements, calcium intake of 1200 mg daily and regular weightbearing physical activities.  3. Special screening examination for human papillomavirus (HPV) - PAP,TP IMGw/HPV RNA,rflx [id number],15/72  Courtney Larsen, it was a pleasure seeing you today!  I will inform you of your results as soon as they are available.

## 2019-05-16 ENCOUNTER — Other Ambulatory Visit: Payer: Self-pay

## 2019-05-16 DIAGNOSIS — Z13820 Encounter for screening for osteoporosis: Secondary | ICD-10-CM

## 2019-05-20 DIAGNOSIS — Z1159 Encounter for screening for other viral diseases: Secondary | ICD-10-CM | POA: Diagnosis not present

## 2019-05-23 ENCOUNTER — Other Ambulatory Visit: Payer: Self-pay

## 2019-05-24 ENCOUNTER — Ambulatory Visit (INDEPENDENT_AMBULATORY_CARE_PROVIDER_SITE_OTHER): Payer: Medicare Other

## 2019-05-24 DIAGNOSIS — Z780 Asymptomatic menopausal state: Secondary | ICD-10-CM

## 2019-05-24 DIAGNOSIS — Z13820 Encounter for screening for osteoporosis: Secondary | ICD-10-CM

## 2019-05-25 ENCOUNTER — Other Ambulatory Visit: Payer: Self-pay | Admitting: Obstetrics & Gynecology

## 2019-05-25 DIAGNOSIS — Z780 Asymptomatic menopausal state: Secondary | ICD-10-CM

## 2019-07-04 ENCOUNTER — Other Ambulatory Visit: Payer: Self-pay

## 2019-07-05 ENCOUNTER — Encounter: Payer: Self-pay | Admitting: Internal Medicine

## 2019-07-05 ENCOUNTER — Ambulatory Visit (INDEPENDENT_AMBULATORY_CARE_PROVIDER_SITE_OTHER): Payer: Medicare Other | Admitting: Internal Medicine

## 2019-07-05 ENCOUNTER — Ambulatory Visit (HOSPITAL_BASED_OUTPATIENT_CLINIC_OR_DEPARTMENT_OTHER)
Admission: RE | Admit: 2019-07-05 | Discharge: 2019-07-05 | Disposition: A | Discharge status: Home / Self Care | Payer: Medicare Other | Source: Ambulatory Visit | Attending: Internal Medicine | Admitting: Internal Medicine

## 2019-07-05 VITALS — BP 119/73 | HR 64 | Temp 97.4°F | Resp 16 | Ht 68.25 in | Wt 178.5 lb

## 2019-07-05 DIAGNOSIS — R059 Cough, unspecified: Secondary | ICD-10-CM

## 2019-07-05 DIAGNOSIS — Z23 Encounter for immunization: Secondary | ICD-10-CM

## 2019-07-05 DIAGNOSIS — H9313 Tinnitus, bilateral: Secondary | ICD-10-CM

## 2019-07-05 DIAGNOSIS — R05 Cough: Secondary | ICD-10-CM

## 2019-07-05 MED ORDER — BUDESONIDE-FORMOTEROL FUMARATE 160-4.5 MCG/ACT IN AERO: 2.0000 | INHALATION_SPRAY | Freq: Two times a day (BID) | RESPIRATORY_TRACT | 3 refills | Status: DC

## 2019-07-05 MED ORDER — AZITHROMYCIN 250 MG PO TABS: ORAL_TABLET | ORAL | 0 refills | Status: DC

## 2019-07-05 NOTE — Patient Instructions (Signed)
  GO TO THE FRONT DESK Schedule your next appointment for checkup in 6 weeks  Start Symbicort, 2 puffs twice a day, rinse your throat with water after each use.  Zithromax, an antibiotic for 5 days  Mucinex twice a day for 1 week, then as needed.  Call if not gradually better

## 2019-07-05 NOTE — Progress Notes (Signed)
Pre visit review using our clinic review tool, if applicable. No additional management support is needed unless otherwise documented below in the visit note. 

## 2019-07-05 NOTE — Progress Notes (Signed)
Subjective:    Patient ID: Courtney Larsen, female    DOB: [birth year], 58 y.o.   MRN: [id number]  DOS:  07/05/2019 Type of visit - description: Acute Having cough for a while, has episode of cough that she cannot stop.  Cough is typically dry however shortly after she feels that she "vomits" clear mucus. Denies hemoptysis or blood in the mucus but sometimes she sees "small black spots ".  Tinnitus: Ongoing issue, new ENT?Marland Kitchen  Review of Systems   No fever chills or weight loss No recent viral syndrome No runny nose, sore throat, itchy eyes or nose. No chest pain With coughing spells, she hurts at the anterior left chest. No nausea or vomiting No heartburn per se No wheezing.   Past Medical History:  Diagnosis Date  . Allergy    SEASONAL  . Anemia   . Asthma    allery induced - inhaler rarely used  . Cyst of breast 06/26/2012   left - resolved on f/u per pt  . Diverticulosis   . Family history of bleeding or clotting disorder    ?Von Willebrand Disease: Patient, sister and mother   . Helicobacter pylori gastritis 02/05/2014   EGD 01/2014  . Hyperlipidemia   . MI, old 2004   "stress induced" per pt  . Personal history of colonic polyp - sessile serrated polyp 11/20/2013    Past Surgical History:  Procedure Laterality Date  . ANTERIOR CERVICAL DECOMP/DISCECTOMY FUSION  07/06/2012   Procedure: ANTERIOR CERVICAL DECOMPRESSION/DISCECTOMY FUSION 1 LEVEL;  Surgeon: Winfield Cunas, MD;  Location: [facility];  Service: Neurosurgery;  Laterality: N/A;  Cervical six-seven anterior cervical decompression with fusion plating and bonegraft  . BACK SURGERY  2011   LOW BACK  . CESAREAN SECTION  1988  . COLONOSCOPY     with snare polypectomy  . WISDOM TOOTH EXTRACTION      Social History   Socioeconomic History  . Marital status: Single    Spouse name: Not on file  . Number of children: 3  . Years of education: 62  . Highest education level: Not on file  Occupational History   . Occupation: Retired  Scientific laboratory technician  . Financial resource strain: Not on file  . Food insecurity    Worry: Not on file    Inability: Not on file  . Transportation needs    Medical: Not on file    Non-medical: Not on file  Tobacco Use  . Smoking status: Never Smoker  . Smokeless tobacco: Never Used  Substance and Sexual Activity  . Alcohol use: No    Alcohol/week: 0.0 standard drinks  . Drug use: No  . Sexual activity: Yes    Partners: Male    Comment: 1st intercourse- 10, partners- 70, current partner- 15 yrs  Lifestyle  . Physical activity    Days per week: Not on file    Minutes per session: Not on file  . Stress: Not on file  Relationships  . Social Herbalist on phone: Not on file    Gets together: Not on file    Attends religious service: Not on file    Active member of club or organization: Not on file    Attends meetings of clubs or organizations: Not on file    Relationship status: Not on file  . Intimate partner violence    Fear of current or ex partner: Not on file    Emotionally abused: Not on file  Physically abused: Not on file    Forced sexual activity: Not on file  Other Topics Concern  . 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   Denies abuse and feels safe at home.       Allergies as of 07/05/2019   No Known Allergies     Medication List    as of July 05, 2019  9:40 AM   You have not been prescribed any medications.         Objective:   Physical Exam BP 119/73 (BP Location: Left Arm, Patient Position: Sitting, Cuff Size: Normal)   Pulse 64   Temp (!) 97.4 F (36.3 C) (Temporal)   Resp 16   Ht 5' 8.25" (1.734 m)   Wt 178 lb 8 oz (81 kg)   LMP 06/26/2010   SpO2 100%   BMI 26.94 kg/m  General:   Well developed,  NAD, BMI noted.  Healthy-appearing HEENT:  Normocephalic . Face symmetric, atraumatic. TMs normal. Throat symmetric.  Poor dentition. Neck: Neck and supraclavicular areas without mass or lymph nodes Lungs:  CTA B but mild large airway congestion with forced cough. Normal respiratory effort, no intercostal retractions, no accessory muscle use. Heart: RRR,  no murmur.  No pretibial edema bilaterally  Skin: Not pale. Not jaundice Neurologic:  alert & oriented X3.  Speech normal, gait appropriate for age and unassisted Psych--  Cognition and judgment appear intact.  Cooperative with normal attention span and concentration.  Behavior appropriate. No anxious or depressed appearing.      Assessment    ASSESSMENT new patient to me 11-2018, transferring due to location Asthma PFTs 02/2014 SPIROMETRY: Severe Airflow Limitation. Significant improvement in airflows after bronchodilator Allergic rhinitis History of asbestos exposure Menopause : age?  EGD 02/01/2014: + H. pylori, treated. Tinnitus  PLAN Asthma: 58 year old lady, never smoker, history of asthma documented at PFTs, responsive to bronchodilators presents with cough.  Apparently she produces sputum although the description is quite peculiar  (she states she does not cough but "vomits" mucus and sometimes "black spots on it") Recommend: Chest x-ray, start Symbicort, empiric Zithromax, atypical bronchitis?.  Reassess in 6 weeks. Consider ask pulmonary to see again. Tinnitus: Would like second opinion about it, I explained the patient that tinnitus is a chronic issue and there is no cure or very effective treatment that I know.  I recommend to stick with her ENT, let them check his hearing as recommended.  Masking tinnitus with background noise is an option. Preventive care See separate documentation RTC 6 weeks

## 2019-07-06 NOTE — Assessment & Plan Note (Signed)
Normal bone density test 04-2019 at gynecology Flu shot today Will discuss Shingrix on RTC

## 2019-07-06 NOTE — Assessment & Plan Note (Signed)
Asthma: 58 year old lady, never smoker, history of asthma documented at PFTs, responsive to bronchodilators presents with cough.  Apparently she produces sputum although the description is quite peculiar  (she states she does not cough but "vomits" mucus and sometimes "black spots on it") Recommend: Chest x-ray, start Symbicort, empiric Zithromax, atypical bronchitis?.  Reassess in 6 weeks. Consider ask pulmonary to see again. Tinnitus: Would like second opinion about it, I explained the patient that tinnitus is a chronic issue and there is no cure or very effective treatment that I know.  I recommend to stick with her ENT, let them check his hearing as recommended.  Masking tinnitus with background noise is an option. Preventive care See separate documentation RTC 6 weeks

## 2019-07-12 ENCOUNTER — Ambulatory Visit (HOSPITAL_BASED_OUTPATIENT_CLINIC_OR_DEPARTMENT_OTHER): Payer: Medicare Other

## 2019-08-17 ENCOUNTER — Ambulatory Visit: Payer: Medicare Other | Admitting: Internal Medicine

## 2019-09-24 NOTE — Progress Notes (Signed)
Virtual Visit via Video Note  I connected with patient on 09/25/19 at  3:15 PM EST by audio enabled telemedicine application and verified that I am speaking with the correct person using two identifiers.   THIS ENCOUNTER IS A VIRTUAL VISIT DUE TO COVID-19 - PATIENT WAS NOT SEEN IN THE OFFICE. PATIENT HAS CONSENTED TO VIRTUAL VISIT / TELEMEDICINE VISIT   Location of patient: home  Location of provider: office  I discussed the limitations of evaluation and management by telemedicine and the availability of in person appointments. The patient expressed understanding and agreed to proceed.   Subjective:   Courtney Larsen is a 58 y.o. female who presents for Medicare Annual (Subsequent) preventive examination.  Retired from Education officer, community in [place].   Review of Systems:  Home Safety/Smoke Alarms: Feels safe in home. Smoke alarms in place.  Lives with fiance. Verbalizes great support system.   Female:   Mammo- active order. Pt states she will schedule soon.    Dexa scan- 05/25/19       CCS-01/27/17. Recall 5 yrs.     Objective:     Vitals: Wt 181 lb (82.1 kg) Comment: pt reported  LMP 06/26/2010   BMI 27.32 kg/m   Body mass index is 27.32 kg/m.  Advanced Directives 09/25/2019 01/13/2017  Does Patient Have a Medical Advance Directive? Yes No  Type of Paramedic of [place];Living will -  Does patient want to make changes to medical advance directive? No - Patient declined -  Copy of [place] in Chart? Yes - validated most recent copy scanned in chart (See row information) -    Tobacco Social History   Tobacco Use  Smoking Status Never Smoker  Smokeless Tobacco Never Used     Counseling given: Not Answered   Clinical Intake: Pain : No/denies pain   Past Medical History:  Diagnosis Date  . Allergy    SEASONAL  . Anemia   . Asthma    allery induced - inhaler rarely used  . Cyst of breast 06/26/2012   left - resolved on f/u  per pt  . Diverticulosis   . Family history of bleeding or clotting disorder    ?Von Willebrand Disease: Patient, sister and mother   . Helicobacter pylori gastritis 02/05/2014   EGD 01/2014  . Hyperlipidemia   . MI, old 2004   "stress induced" per pt  . Personal history of colonic polyp - sessile serrated polyp 11/20/2013   Past Surgical History:  Procedure Laterality Date  . ANTERIOR CERVICAL DECOMP/DISCECTOMY FUSION  07/06/2012   Procedure: ANTERIOR CERVICAL DECOMPRESSION/DISCECTOMY FUSION 1 LEVEL;  Surgeon: Winfield Cunas, MD;  Location: [facility];  Service: Neurosurgery;  Laterality: N/A;  Cervical six-seven anterior cervical decompression with fusion plating and bonegraft  . BACK SURGERY  2011   LOW BACK  . CESAREAN SECTION  1988  . COLONOSCOPY     with snare polypectomy  . WISDOM TOOTH EXTRACTION     Family History  Adopted: Yes  Problem Relation Age of Onset  . Diabetes Mother   . Ulcers Mother   . Bleeding Disorder Mother   . Early death Mother   . Learning disabilities Mother   . Heart attack Mother   . Cancer Sister 79       BRAIN TUMOR  . Breast cancer Sister 25  . Heart disease Sister   . Bleeding Disorder Sister   . Colon cancer Neg Hx   . Esophageal cancer Neg  Hx   . Pancreatic cancer Neg Hx   . Rectal cancer Neg Hx   . Stomach cancer Neg Hx    Social History   Socioeconomic History  . Marital status: Single    Spouse name: Not on file  . Number of children: 3  . Years of education: 74  . Highest education level: Not on file  Occupational History  . Occupation: Retired  Tobacco Use  . Smoking status: Never Smoker  . Smokeless tobacco: Never Used  Substance and Sexual Activity  . Alcohol use: No    Alcohol/week: 0.0 standard drinks  . Drug use: No  . Sexual activity: Yes    Partners: Male    Comment: 1st intercourse- 33, partners- 86, current partner- 15 yrs  Other Topics Concern  . 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   Denies abuse and feels safe at home.    Social Determinants of Health   Financial Resource Strain:   . Difficulty of Paying Living Expenses: Not on file  Food Insecurity:   . Worried About Charity fundraiser in the Last Year: Not on file  . Ran Out of Food in the Last Year: Not on file  Transportation Needs:   . Lack of Transportation (Medical): Not on file  . Lack of Transportation (Non-Medical): Not on file  Physical Activity:   . Days of Exercise per Week: Not on file  . Minutes of Exercise per Session: Not on file  Stress:   . Feeling of Stress : Not on file  Social Connections:   . Frequency of Communication with Friends and Family: Not on file  . Frequency of Social Gatherings with Friends and Family: Not on file  . Attends Religious Services: Not on file  . Active Member of Clubs or Organizations: Not on file  . Attends Archivist Meetings: Not on file  . Marital Status: Not on file    Outpatient Encounter Medications as of 09/25/2019  Medication Sig  . [DISCONTINUED] azithromycin (ZITHROMAX Z-PAK) 250 MG tablet 2 tabs a day the first day, then 1 tab a day x 4 days  . [DISCONTINUED] budesonide-formoterol (SYMBICORT) 160-4.5 MCG/ACT inhaler Inhale 2 puffs into the lungs 2 (two) times daily.   No facility-administered encounter medications on file as of 09/25/2019.    Activities of Daily Living In your present state of health, do you have any difficulty performing the following activities: 09/25/2019 12/18/2018  Hearing? N N  Vision? N N  Difficulty concentrating or making decisions? N N  Walking or climbing stairs? N N  Dressing or bathing? N N  Doing errands, shopping? N N  Preparing Food and eating ? N -  Using the Toilet? N -  In the past six months, have you  accidently leaked urine? N -  Do you have problems with loss of bowel control? N -  Managing your Medications? N -  Managing your Finances? N -  Housekeeping or managing your Housekeeping? N -  Some recent data might be hidden    Patient Care Team: Colon Branch, MD as PCP - General (Internal Medicine) Ashok Pall, MD (Neurosurgery)    Assessment:   This is a routine wellness examination  for [employer]. Physical assessment deferred to PCP.  Exercise Activities and Dietary recommendations Current Exercise Habits: Home exercise routine, Type of exercise: stretching;walking, Time (Minutes): 30, Frequency (Times/Week): 7, Weekly Exercise (Minutes/Week): 210, Intensity: Mild, Exercise limited by: None identified Diet (meal preparation, eat out, water intake, caffeinated beverages, dairy products, fruits and vegetables): in general, a "healthy" diet  , well balanced     Goals    . Patient Stated     Maintain healthy active lifestyle.        Fall Risk Fall Risk  09/25/2019 07/05/2019 04/18/2017  Falls in the past year? 0 0 No  Follow up Education provided;Falls prevention discussed Falls evaluation completed -   Depression Screen PHQ 2/9 Scores 09/25/2019 12/18/2018 04/18/2017  PHQ - 2 Score 0 0 0  PHQ- 9 Score - 3 -     Cognitive Function Ad8 score reviewed for issues:  Issues making decisions:no  Less interest in hobbies / activities:no  Repeats questions, stories (family complaining):no  Trouble using ordinary gadgets (microwave, computer, phone):no  Forgets the month or year: no  Mismanaging finances: no  Remembering appts:no Daily problems with thinking and/or memory:no Ad8 score is=0         Immunization History  Administered Date(s) Administered  . Influenza,inj,Quad PF,6+ Mos 06/27/2017, 07/05/2019  . Pneumococcal Polysaccharide-23 07/07/2012  . Tdap 06/26/2012     Screening Tests Health Maintenance  Topic Date Due  . MAMMOGRAM  04/20/2018  .  COLONOSCOPY  01/27/2022  . PAP SMEAR-Modifier  05/09/2022  . TETANUS/TDAP  06/26/2022  . INFLUENZA VACCINE  Completed  . Hepatitis C Screening  Completed  . HIV Screening  Completed      Plan:   See you next year!  Continue to eat heart healthy diet (full of fruits, vegetables, whole grains, lean protein, water--limit salt, fat, and sugar intake) and increase physical activity as tolerated.  Continue doing brain stimulating activities (puzzles, reading, adult coloring books, staying active) to keep memory sharp.     I have personally reviewed and noted the following in the patient's chart:   . Medical and social history . Use of alcohol, tobacco or illicit drugs  . Current medications and supplements . Functional ability and status . Nutritional status . Physical activity . Advanced directives . List of other physicians . Hospitalizations, surgeries, and ER visits in previous 12 months . Vitals . Screenings to include cognitive, depression, and falls . Referrals and appointments  In addition, I have reviewed and discussed with patient certain preventive protocols, quality metrics, and best practice recommendations. A written personalized care plan for preventive services as well as general preventive health recommendations were provided to patient.     Shela Nevin, [place]  09/25/2019

## 2019-09-25 ENCOUNTER — Other Ambulatory Visit: Payer: Self-pay

## 2019-09-25 ENCOUNTER — Ambulatory Visit (INDEPENDENT_AMBULATORY_CARE_PROVIDER_SITE_OTHER): Payer: Medicare Other | Admitting: *Deleted

## 2019-09-25 ENCOUNTER — Encounter: Payer: Self-pay | Admitting: *Deleted

## 2019-09-25 VITALS — Wt 181.0 lb

## 2019-09-25 DIAGNOSIS — Z0000 Encounter for general adult medical examination without abnormal findings: Secondary | ICD-10-CM

## 2019-09-25 NOTE — Patient Instructions (Signed)
See you next year!  Continue to eat heart healthy diet (full of fruits, vegetables, whole grains, lean protein, water--limit salt, fat, and sugar intake) and increase physical activity as tolerated.  Continue doing brain stimulating activities (puzzles, reading, adult coloring books, staying active) to keep memory sharp.    Ms. Wycoff , Thank you for taking time to come for your Medicare Wellness Visit. I appreciate your ongoing commitment to your health goals. Please review the following plan we discussed and let me know if I can assist you in the future.   These are the goals we discussed: Goals    . Patient Stated     Maintain healthy active lifestyle.        This is a list of the screening recommended for you and due dates:  Health Maintenance  Topic Date Due  . Mammogram  04/20/2018  . Colon Cancer Screening  01/27/2022  . Pap Smear  05/09/2022  . Tetanus Vaccine  06/26/2022  . Flu Shot  Completed  .  Hepatitis C: One time screening is recommended by Center for Disease Control  (CDC) for  adults born from [birth year] through 1967.   Completed  . HIV Screening  Completed    Preventive Care 60-42 Years Old, Female Preventive care refers to visits with your health care provider and lifestyle choices that can promote health and wellness. This includes:  A yearly physical exam. This may also be called an annual well check.  Regular dental visits and eye exams.  Immunizations.  Screening for certain conditions.  Healthy lifestyle choices, such as eating a healthy diet, getting regular exercise, not using drugs or products that contain nicotine and tobacco, and limiting alcohol use. What can I expect for my preventive care visit? Physical exam Your health care provider will check your:  Height and weight. This may be used to calculate body mass index (BMI), which tells if you are at a healthy weight.  Heart rate and blood pressure.  Skin for abnormal spots. Counseling Your  health care provider may ask you questions about your:  Alcohol, tobacco, and drug use.  Emotional well-being.  Home and relationship well-being.  Sexual activity.  Eating habits.  Work and work Statistician.  Method of birth control.  Menstrual cycle.  Pregnancy history. What immunizations do I need?  Influenza (flu) vaccine  This is recommended every year. Tetanus, diphtheria, and pertussis (Tdap) vaccine  You may need a Td booster every 10 years. Varicella (chickenpox) vaccine  You may need this if you have not been vaccinated. Zoster (shingles) vaccine  You may need this after age 54. Measles, mumps, and rubella (MMR) vaccine  You may need at least one dose of MMR if you were born in 1957 or later. You may also need a second dose. Pneumococcal conjugate (PCV13) vaccine  You may need this if you have certain conditions and were not previously vaccinated. Pneumococcal polysaccharide (PPSV23) vaccine  You may need one or two doses if you smoke cigarettes or if you have certain conditions. Meningococcal conjugate (MenACWY) vaccine  You may need this if you have certain conditions. Hepatitis A vaccine  You may need this if you have certain conditions or if you travel or work in places where you may be exposed to hepatitis A. Hepatitis B vaccine  You may need this if you have certain conditions or if you travel or work in places where you may be exposed to hepatitis B. Haemophilus influenzae type b (Hib) vaccine  You may need this if you have certain conditions. Human papillomavirus (HPV) vaccine  If recommended by your health care provider, you may need three doses over 6 months. You may receive vaccines as individual doses or as more than one vaccine together in one shot (combination vaccines). Talk with your health care provider about the risks and benefits of combination vaccines. What tests do I need? Blood tests  Lipid and cholesterol levels. These may  be checked every 5 years, or more frequently if you are over 60 years old.  Hepatitis C test.  Hepatitis B test. Screening  Lung cancer screening. You may have this screening every year starting at age 60 if you have a 30-pack-year history of smoking and currently smoke or have quit within the past 15 years.  Colorectal cancer screening. All adults should have this screening starting at age 60 and continuing until age 79. Your health care provider may recommend screening at age 60 if you are at increased risk. You will have tests every 1-10 years, depending on your results and the type of screening test.  Diabetes screening. This is done by checking your blood sugar (glucose) after you have not eaten for a while (fasting). You may have this done every 1-3 years.  Mammogram. This may be done every 1-2 years. Talk with your health care provider about when you should start having regular mammograms. This may depend on whether you have a family history of breast cancer.  BRCA-related cancer screening. This may be done if you have a family history of breast, ovarian, tubal, or peritoneal cancers.  Pelvic exam and Pap test. This may be done every 3 years starting at age 60. Starting at age 60, this may be done every 5 years if you have a Pap test in combination with an HPV test. Other tests  Sexually transmitted disease (STD) testing.  Bone density scan. This is done to screen for osteoporosis. You may have this scan if you are at high risk for osteoporosis. Follow these instructions at home: Eating and drinking  Eat a diet that includes fresh fruits and vegetables, whole grains, lean protein, and low-fat dairy.  Take vitamin and mineral supplements as recommended by your health care provider.  Do not drink alcohol if: ? Your health care provider tells you not to drink. ? You are pregnant, may be pregnant, or are planning to become pregnant.  If you drink alcohol: ? Limit how much you  have to 0-1 drink a day. ? Be aware of how much alcohol is in your drink. In the [place] one drink equals one 12 oz bottle of beer (355 mL), one 5 oz glass of wine (148 mL), or one 1 oz glass of hard liquor (44 mL). Lifestyle  Take daily care of your teeth and gums.  Stay active. Exercise for at least 30 minutes on 5 or more days each week.  Do not use any products that contain nicotine or tobacco, such as cigarettes, e-cigarettes, and chewing tobacco. If you need help quitting, ask your health care provider.  If you are sexually active, practice safe sex. Use a condom or other form of birth control (contraception) in order to prevent pregnancy and STIs (sexually transmitted infections).  If told by your health care provider, take low-dose aspirin daily starting at age 1. What's next?  Visit your health care provider once a year for a well check visit.  Ask your health care provider how often you should have your eyes  and teeth checked.  Stay up to date on all vaccines. This information is not intended to replace advice given to you by your health care provider. Make sure you discuss any questions you have with your health care provider. Document Released: 10/10/2015 Document Revised: 05/25/2018 Document Reviewed: 05/25/2018 Elsevier Patient Education  2020 [employer].

## 2019-10-05 ENCOUNTER — Ambulatory Visit (HOSPITAL_BASED_OUTPATIENT_CLINIC_OR_DEPARTMENT_OTHER): Payer: Medicare Other

## 2019-12-19 ENCOUNTER — Encounter: Payer: Medicare Other | Admitting: Internal Medicine

## 2020-01-07 ENCOUNTER — Encounter: Payer: PRIVATE HEALTH INSURANCE | Admitting: Internal Medicine

## 2020-01-15 ENCOUNTER — Encounter: Payer: PRIVATE HEALTH INSURANCE | Admitting: Internal Medicine

## 2020-01-16 ENCOUNTER — Other Ambulatory Visit: Payer: Self-pay

## 2020-01-17 ENCOUNTER — Encounter: Payer: Self-pay | Admitting: Internal Medicine

## 2020-01-17 ENCOUNTER — Ambulatory Visit (INDEPENDENT_AMBULATORY_CARE_PROVIDER_SITE_OTHER): Payer: Medicare Other | Admitting: Internal Medicine

## 2020-01-17 VITALS — BP 132/62 | HR 79 | Temp 97.4°F | Resp 16 | Ht 68.0 in | Wt 179.4 lb

## 2020-01-17 DIAGNOSIS — Z0000 Encounter for general adult medical examination without abnormal findings: Secondary | ICD-10-CM

## 2020-01-17 DIAGNOSIS — H9319 Tinnitus, unspecified ear: Secondary | ICD-10-CM | POA: Diagnosis not present

## 2020-01-17 DIAGNOSIS — Z1231 Encounter for screening mammogram for malignant neoplasm of breast: Secondary | ICD-10-CM

## 2020-01-17 NOTE — Progress Notes (Signed)
auPre visit review using our clinic review tool, if applicable. No additional management support is needed unless otherwise documented below in the visit note.

## 2020-01-17 NOTE — Progress Notes (Signed)
Subjective:    Patient ID: Courtney Larsen, female    DOB: February 26, 1961, 59 y.o.   MRN: [id number]  DOS:  01/17/2020 Type of visit - description: CPX Since the last office visit she is doing well   Review of Systems Ongoing tinnitus and mild decreased hearing  Other than above, a 14 point review of systems is negative      Past Medical History:  Diagnosis Date  . Allergy    SEASONAL  . Anemia   . Asthma    allery induced - inhaler rarely used  . Cyst of breast 06/26/2012   left - resolved on f/u per pt  . Diverticulosis   . Family history of bleeding or clotting disorder    ?Von Willebrand Disease: Patient, sister and mother   . Helicobacter pylori gastritis 02/05/2014   EGD 01/2014  . Hyperlipidemia   . MI, old 2004   "stress induced" per pt  . Personal history of colonic polyp - sessile serrated polyp 11/20/2013    Past Surgical History:  Procedure Laterality Date  . ANTERIOR CERVICAL DECOMP/DISCECTOMY FUSION  07/06/2012   Procedure: ANTERIOR CERVICAL DECOMPRESSION/DISCECTOMY FUSION 1 LEVEL;  Surgeon: Winfield Cunas, MD;  Location: [place] [facility];  Service: Neurosurgery;  Laterality: N/A;  Cervical six-seven anterior cervical decompression with fusion plating and bonegraft  . BACK SURGERY  2011   LOW BACK  . CESAREAN SECTION  1988  . COLONOSCOPY     with snare polypectomy  . WISDOM TOOTH EXTRACTION     Family History  Adopted: Yes  Problem Relation Age of Onset  . Diabetes Mother   . Ulcers Mother   . Bleeding Disorder Mother   . Early death Mother   . Learning disabilities Mother   . Heart attack Mother   . Cancer Sister 76       BRAIN TUMOR  . Breast cancer Sister 13  . Heart disease Sister   . Bleeding Disorder Sister   . Colon cancer Neg Hx   . Esophageal cancer Neg Hx   . Pancreatic cancer Neg Hx   . Rectal cancer Neg Hx   . Stomach cancer Neg Hx     Allergies as of 01/17/2020   No Known Allergies     Medication List    as of January 17, 2020  11:59 PM   You have not been prescribed any medications.        Objective:   Physical Exam BP 132/62 (BP Location: Left Arm, Patient Position: Sitting, Cuff Size: Small)   Pulse 79   Temp (!) 97.4 F (36.3 C) (Temporal)   Resp 16   Ht 5\' 8"  (1.727 m)   Wt 179 lb 6 oz (81.4 kg)   LMP 06/26/2010   SpO2 99%   BMI 27.27 kg/m  General: Well developed, NAD, BMI noted Neck: No  thyromegaly  HEENT:  Normocephalic . Face symmetric, atraumatic Lungs:  CTA B Normal respiratory effort, no intercostal retractions, no accessory muscle use. Heart: RRR,  no murmur.  Abdomen:  Not distended, soft, non-tender. No rebound or rigidity.   Lower extremities: no pretibial edema bilaterally  Skin: Exposed areas without rash. Not pale. Not jaundice Neurologic:  alert & oriented X3.  Speech normal, gait appropriate for age and unassisted Strength symmetric and appropriate for age.  Psych: Cognition and judgment appear intact.  Cooperative with normal attention span and concentration.  Behavior appropriate. No anxious or depressed appearing.     Assessment  ASSESSMENT new patient to me 11-2018, transferring due to location Asthma PFTs 02/2014 SPIROMETRY: Severe Airflow Limitation. Significant improvement in airflows after bronchodilator Allergic rhinitis History of asbestos exposure Menopause : age? EGD 02/01/2014: + H. pylori, treated. Tinnitus  PLAN Here for CPX Asthma: Completely asymptomatic at this point. History of asbestos exposure?  Recently pt called pulmonary, there was no evidence of such on PFTs.  She is asymptomatic. Tinnitus: Ongoing associated with decreased hearing.  Was seen by Dr. Janace Hoard ENT back in 2018.  Would like to see somebody regularly, referred to [facility] audiology. RTC 1 year     This visit occurred during the SARS-CoV-2 public health emergency.  Safety protocols were in place, including screening questions prior to the visit, additional usage of staff PPE,  and extensive cleaning of exam room while observing appropriate contact time as indicated for disinfecting solutions.

## 2020-01-17 NOTE — Patient Instructions (Signed)
  GO TO THE LAB : Get the blood work     [place], [place] Come back for   for physical exam in 1 year  Please go to the first floor and schedule your mammogram   LEARN ABOUT CALCIUM AND VITAMIN D  Calcium and Vitamin D are important to help keep your bones healthy and prevent osteoporosis. Healthy calcium and Vitamin D levels can be reached by increasing calcium and vitamin D in your diet, or by taking supplements over the counter.  The current recommendations are as follows:  Increasing calcium and vitamin D is generally well tolerated in most people. Some people may get constipated (with CALCIUM).   RECCOMENDATIONS -Postmenopausal women:    1200mg  calcium and 800 IU (20 micrograms) vitamin D daily

## 2020-01-18 ENCOUNTER — Ambulatory Visit (HOSPITAL_BASED_OUTPATIENT_CLINIC_OR_DEPARTMENT_OTHER): Payer: Medicare Other

## 2020-01-18 LAB — COMPREHENSIVE METABOLIC PANEL
ALT: 11 U/L (ref 0–35)
AST: 15 U/L (ref 0–37)
Albumin: 4.3 g/dL (ref 3.5–5.2)
Alkaline Phosphatase: 65 U/L (ref 39–117)
BUN: 16 mg/dL (ref 6–23)
CO2: 31 mEq/L (ref 19–32)
Calcium: 8.9 mg/dL (ref 8.4–10.5)
Chloride: 105 mEq/L (ref 96–112)
Creatinine, Ser: 0.8 mg/dL (ref 0.40–1.20)
GFR: 88.38 mL/min (ref >60.00)
Glucose, Bld: 91 mg/dL (ref 70–99)
Potassium: 4.1 mEq/L (ref 3.5–5.1)
Sodium: 142 mEq/L (ref 135–145)
Total Bilirubin: 0.4 mg/dL (ref 0.2–1.2)
Total Protein: 7.1 g/dL (ref 6.0–8.3)

## 2020-01-18 LAB — LIPID PANEL
Cholesterol: 241 mg/dL — ABNORMAL HIGH (ref 0–200)
HDL: 59 mg/dL (ref >39.00)
LDL Cholesterol: 170 mg/dL — ABNORMAL HIGH (ref 0–99)
NonHDL: 181.85
Total CHOL/HDL Ratio: 4
Triglycerides: 61 mg/dL (ref 0.0–149.0)
VLDL: 12.2 mg/dL (ref 0.0–40.0)

## 2020-01-18 LAB — CBC WITH DIFFERENTIAL/PLATELET
Basophils Absolute: 0 10*3/uL (ref 0.0–0.1)
Basophils Relative: 1 % (ref 0.0–3.0)
Eosinophils Absolute: 0.2 10*3/uL (ref 0.0–0.7)
Eosinophils Relative: 4.6 % (ref 0.0–5.0)
HCT: 38.5 % (ref 36.0–46.0)
Hemoglobin: 12.4 g/dL (ref 12.0–15.0)
Lymphocytes Relative: 45 % (ref 12.0–46.0)
Lymphs Abs: 1.6 10*3/uL (ref 0.7–4.0)
MCHC: 32.1 g/dL (ref 30.0–36.0)
MCV: 90.4 fl (ref 78.0–100.0)
Monocytes Absolute: 0.3 10*3/uL (ref 0.1–1.0)
Monocytes Relative: 8.9 % (ref 3.0–12.0)
Neutro Abs: 1.4 10*3/uL (ref 1.4–7.7)
Neutrophils Relative %: 40.5 % — ABNORMAL LOW (ref 43.0–77.0)
Platelets: 212 10*3/uL (ref 150.0–400.0)
RBC: 4.25 Mil/uL (ref 3.87–5.11)
RDW: 13.9 % (ref 11.5–15.5)
WBC: 3.5 10*3/uL — ABNORMAL LOW (ref 4.0–10.5)

## 2020-01-19 NOTE — Assessment & Plan Note (Signed)
Here for CPX Asthma: Completely asymptomatic at this point. History of asbestos exposure?  Recently pt called pulmonary, there was no evidence of such on PFTs.  She is asymptomatic. Tinnitus: Ongoing associated with decreased hearing.  Was seen by Dr. Janace Hoard ENT back in 2018.  Would like to see somebody regularly, referred to [facility] audiology. RTC 1 year

## 2020-01-19 NOTE — Assessment & Plan Note (Signed)
-  Td 2013 - PNM 23: 2013. -Shingrix: Recommend to proceed at her pharmacy -Covid vaccination x 2 CCS: Had a colonoscopy 2015 and 01/27/2017, next 2023 Female care: Gynecological visit-2020, had a normal DEXA.  Last mammogram 03-2016, ordering one. Recommend calcium and vitamin D -Diet exercise discussed -Labs: CMP, FLP, CBC

## 2020-03-12 DIAGNOSIS — H903 Sensorineural hearing loss, bilateral: Secondary | ICD-10-CM | POA: Diagnosis not present

## 2020-03-20 ENCOUNTER — Ambulatory Visit (HOSPITAL_BASED_OUTPATIENT_CLINIC_OR_DEPARTMENT_OTHER)
Admission: RE | Admit: 2020-03-20 | Discharge: 2020-03-20 | Disposition: A | Discharge status: Home / Self Care | Payer: Medicare Other | Source: Ambulatory Visit | Attending: Internal Medicine | Admitting: Internal Medicine

## 2020-03-20 ENCOUNTER — Other Ambulatory Visit: Payer: Self-pay

## 2020-03-20 DIAGNOSIS — Z1231 Encounter for screening mammogram for malignant neoplasm of breast: Secondary | ICD-10-CM | POA: Diagnosis not present

## 2020-03-21 ENCOUNTER — Ambulatory Visit (HOSPITAL_BASED_OUTPATIENT_CLINIC_OR_DEPARTMENT_OTHER): Payer: Medicare Other

## 2020-04-08 DIAGNOSIS — H903 Sensorineural hearing loss, bilateral: Secondary | ICD-10-CM | POA: Diagnosis not present

## 2020-04-08 DIAGNOSIS — H9313 Tinnitus, bilateral: Secondary | ICD-10-CM | POA: Diagnosis not present

## 2020-04-08 DIAGNOSIS — R42 Dizziness and giddiness: Secondary | ICD-10-CM | POA: Diagnosis not present

## 2020-10-02 ENCOUNTER — Other Ambulatory Visit: Payer: Self-pay

## 2020-10-02 ENCOUNTER — Ambulatory Visit
Admission: EM | Admit: 2020-10-02 | Discharge: 2020-10-02 | Disposition: A | Discharge status: Home / Self Care | Payer: Medicare Other | Attending: Internal Medicine | Admitting: Internal Medicine

## 2020-10-02 DIAGNOSIS — Z20822 Contact with and (suspected) exposure to covid-19: Secondary | ICD-10-CM | POA: Diagnosis not present

## 2020-10-02 NOTE — Discharge Instructions (Signed)
Increase fluid intake °If your symptoms worsen please return to the urgent care °Take medications as directed °Please quarantine until COVID-19 test results are available °We will call you with recommendations if your lab results are abnormal. °

## 2020-10-02 NOTE — ED Provider Notes (Signed)
[facility]    CSN: [id number] Arrival date & time: 10/02/20  0830      History   Chief Complaint Chief Complaint  Patient presents with  . Cough    HPI Courtney Larsen is a 60 y.o. female with a history of chronic cough comes to urgent care for COVID-19 testing after exposure to a Covid positive individual.  Patient has cough is not productive.  Patient has some itchy throat does not out of the ordinary.  She denies any fever or chills.  HPI  Past Medical History:  Diagnosis Date  . Allergy    SEASONAL  . Anemia   . Asthma    allery induced - inhaler rarely used  . Cyst of breast 06/26/2012   left - resolved on f/u per pt  . Diverticulosis   . Family history of bleeding or clotting disorder    ?Von Willebrand Disease: Patient, sister and mother   . Helicobacter pylori gastritis 02/05/2014   EGD 01/2014  . Hyperlipidemia   . MI, old 2004   "stress induced" per pt  . Personal history of colonic polyp - sessile serrated polyp 11/20/2013    Patient Active Problem List   Diagnosis Date Noted  . PCP NOTES >>>>>>>>>>>>>>> 12/18/2018  . Allergic rhinitis 08/11/2017  . Annual physical exam 04/18/2017  . Tinnitus of both ears 04/18/2017  . Upper abdominal pain 01/12/2016  . Medicare annual wellness visit, subsequent 04/29/2015  . Menopause 03/11/2015  . Cough 04/04/2014  . Asthma, mild intermittent 03/05/2014  . Asbestos exposure 03/05/2014  . Helicobacter pylori gastritis 02/05/2014  . Personal history of colonic polyp - sessile serrated polyp 11/20/2013  . Family history of breast cancer 06/26/2012  . Family history of bleeding or clotting disorder 06/26/2012  . Cyst of breast 06/26/2012    Past Surgical History:  Procedure Laterality Date  . ANTERIOR CERVICAL DECOMP/DISCECTOMY FUSION  07/06/2012   Procedure: ANTERIOR CERVICAL DECOMPRESSION/DISCECTOMY FUSION 1 LEVEL;  Surgeon: Carmela Hurt, MD;  Location: [facility];  Service: Neurosurgery;   Laterality: N/A;  Cervical six-seven anterior cervical decompression with fusion plating and bonegraft  . BACK SURGERY  2011   LOW BACK  . CESAREAN SECTION  1988  . COLONOSCOPY     with snare polypectomy  . WISDOM TOOTH EXTRACTION      OB History    Gravida  3   Para  3   Term  2   Preterm  1   AB      Living  3     SAB      IAB      Ectopic      Multiple      Live Births  3            Home Medications    Prior to Admission medications   Not on File    Family History Family History  Adopted: Yes  Problem Relation Age of Onset  . Diabetes Mother   . Ulcers Mother   . Bleeding Disorder Mother   . Early death Mother   . Learning disabilities Mother   . Heart attack Mother   . Cancer Sister 44       BRAIN TUMOR  . Breast cancer Sister 82  . Heart disease Sister   . Bleeding Disorder Sister   . Colon cancer Neg Hx   . Esophageal cancer Neg Hx   . Pancreatic cancer Neg Hx   . Rectal cancer Neg  Hx   . Stomach cancer Neg Hx     Social History Social History   Tobacco Use  . Smoking status: Never Smoker  . Smokeless tobacco: Never Used  Vaping Use  . Vaping Use: Never used  Substance Use Topics  . Alcohol use: No    Alcohol/week: 0.0 standard drinks  . Drug use: No     Allergies   Patient has no known allergies.   Review of Systems Review of Systems  Respiratory: Positive for cough. Negative for chest tightness, shortness of breath and wheezing.   Cardiovascular: Negative for chest pain.     Physical Exam Triage Vital Signs ED Triage Vitals  Enc Vitals Group     BP 10/02/20 0903 111/65     Pulse Rate 10/02/20 0903 68     Resp 10/02/20 0903 18     Temp 10/02/20 0903 98.4 F (36.9 C)     Temp Source 10/02/20 0903 Oral     SpO2 10/02/20 0903 96 %     Weight --      Height --      Head Circumference --      Peak Flow --      Pain Score 10/02/20 0904 0     Pain Loc --      Pain Edu? --      Excl. in [place]? --    No data  found.  Updated Vital Signs BP 111/65 (BP Location: Left Arm)   Pulse 68   Temp 98.4 F (36.9 C) (Oral)   Resp 18   LMP 06/26/2010   SpO2 96%   Visual Acuity Right Eye Distance:   Left Eye Distance:   Bilateral Distance:    Right Eye Near:   Left Eye Near:    Bilateral Near:     Physical Exam Vitals and nursing note reviewed.  Constitutional:      General: She is not in acute distress.    Appearance: She is not ill-appearing.  Cardiovascular:     Rate and Rhythm: Normal rate and regular rhythm.     Pulses: Normal pulses.     Heart sounds: Normal heart sounds.  Pulmonary:     Effort: Pulmonary effort is normal.     Breath sounds: Normal breath sounds.  Neurological:     Mental Status: She is alert.      UC Treatments / Results  Labs (all labs ordered are listed, but only abnormal results are displayed) Labs Reviewed  NOVEL CORONAVIRUS, NAA    EKG   Radiology No results found.  Procedures Procedures (including critical care time)  Medications Ordered in UC Medications - No data to display  Initial Impression / Assessment and Plan / UC Course  I have reviewed the triage vital signs and the nursing notes.  Pertinent labs & imaging results that were available during my care of the patient were reviewed by me and considered in my medical decision making (see chart for details).     1.  Exposure to COVID-19 virus: COVID-19 PCR test sent Quarantine until COVID-19 test results are available Use over-the-counter remedies for symptoms If symptoms worsen please return to urgent care to be reevaluated We will call you with recommendations if your labs are abnormal. Final Clinical Impressions(s) / UC Diagnoses   Final diagnoses:  Encounter for screening laboratory testing for COVID-19 virus  Exposure to COVID-19 virus     Discharge Instructions     Increase fluid intake If your symptoms worsen  please return to the urgent care Take medications as  directed Please quarantine until COVID-19 test results are available We will call you with recommendations if your lab results are abnormal.   ED Prescriptions    None     PDMP not reviewed this encounter.   Chase Picket, MD 10/02/20 [phone]

## 2020-10-02 NOTE — ED Triage Notes (Signed)
Pt c/o cough and itchy throat for over a week. States her husband has had covid exposure.

## 2020-10-04 LAB — NOVEL CORONAVIRUS, NAA: SARS-CoV-2, NAA: NOT DETECTED

## 2020-10-04 LAB — SARS-COV-2, NAA 2 DAY TAT

## 2020-12-09 ENCOUNTER — Encounter: Payer: Self-pay | Admitting: Internal Medicine

## 2021-01-19 ENCOUNTER — Ambulatory Visit (INDEPENDENT_AMBULATORY_CARE_PROVIDER_SITE_OTHER): Payer: Medicare Other | Admitting: Internal Medicine

## 2021-01-19 ENCOUNTER — Encounter: Payer: Self-pay | Admitting: Internal Medicine

## 2021-01-19 ENCOUNTER — Other Ambulatory Visit: Payer: Self-pay

## 2021-01-19 VITALS — BP 121/70 | HR 75 | Temp 97.0°F | Ht 68.0 in | Wt 177.0 lb

## 2021-01-19 DIAGNOSIS — Z0000 Encounter for general adult medical examination without abnormal findings: Secondary | ICD-10-CM | POA: Diagnosis not present

## 2021-01-19 DIAGNOSIS — E785 Hyperlipidemia, unspecified: Secondary | ICD-10-CM

## 2021-01-19 LAB — CBC WITH DIFFERENTIAL/PLATELET
Basophils Absolute: 0 10*3/uL (ref 0.0–0.1)
Basophils Relative: 0.8 % (ref 0.0–3.0)
Eosinophils Absolute: 0.1 10*3/uL (ref 0.0–0.7)
Eosinophils Relative: 3.3 % (ref 0.0–5.0)
HCT: 34.8 % — ABNORMAL LOW (ref 36.0–46.0)
Hemoglobin: 11.4 g/dL — ABNORMAL LOW (ref 12.0–15.0)
Lymphocytes Relative: 34.2 % (ref 12.0–46.0)
Lymphs Abs: 1.3 10*3/uL (ref 0.7–4.0)
MCHC: 32.9 g/dL (ref 30.0–36.0)
MCV: 89.3 fl (ref 78.0–100.0)
Monocytes Absolute: 0.4 10*3/uL (ref 0.1–1.0)
Monocytes Relative: 10.4 % (ref 3.0–12.0)
Neutro Abs: 2 10*3/uL (ref 1.4–7.7)
Neutrophils Relative %: 51.3 % (ref 43.0–77.0)
Platelets: 233 10*3/uL (ref 150.0–400.0)
RBC: 3.9 Mil/uL (ref 3.87–5.11)
RDW: 14.2 % (ref 11.5–15.5)
WBC: 3.9 10*3/uL — ABNORMAL LOW (ref 4.0–10.5)

## 2021-01-19 LAB — COMPREHENSIVE METABOLIC PANEL
ALT: 11 U/L (ref 0–35)
AST: 13 U/L (ref 0–37)
Albumin: 3.8 g/dL (ref 3.5–5.2)
Alkaline Phosphatase: 64 U/L (ref 39–117)
BUN: 11 mg/dL (ref 6–23)
CO2: 30 mEq/L (ref 19–32)
Calcium: 9 mg/dL (ref 8.4–10.5)
Chloride: 105 mEq/L (ref 96–112)
Creatinine, Ser: 0.77 mg/dL (ref 0.40–1.20)
GFR: 83.2 mL/min (ref >60.00)
Glucose, Bld: 87 mg/dL (ref 70–99)
Potassium: 3.9 mEq/L (ref 3.5–5.1)
Sodium: 141 mEq/L (ref 135–145)
Total Bilirubin: 0.6 mg/dL (ref 0.2–1.2)
Total Protein: 6.9 g/dL (ref 6.0–8.3)

## 2021-01-19 LAB — LIPID PANEL
Cholesterol: 200 mg/dL (ref 0–200)
HDL: 57.9 mg/dL (ref >39.00)
LDL Cholesterol: 129 mg/dL — ABNORMAL HIGH (ref 0–99)
NonHDL: 141.7
Total CHOL/HDL Ratio: 3
Triglycerides: 63 mg/dL (ref 0.0–149.0)
VLDL: 12.6 mg/dL (ref 0.0–40.0)

## 2021-01-19 NOTE — Patient Instructions (Addendum)
Rash: Apply over-the-counter "ringworm medication" twice daily for few days You can also apply over-the-counter hydrocortisone 1% cream as needed for itching.  GO TO THE LAB : Get the blood work     [place], [place] Come back for   a physical exam in 1 year

## 2021-01-19 NOTE — Progress Notes (Signed)
Subjective:    Patient ID: Courtney Larsen, female    DOB: Oct 13, 1960, 60 y.o.   MRN: [id number]  DOS:  01/19/2021 Type of visit - description: CPX  Since the last office visit is doing well. A couple of weeks ago noted a rash at the right calf, slightly itchy.  Not painful.  Also, 1 month history of mild shoulder pain, L. Pain increases with ROM, no neck pain per se, no numbness.   Review of Systems  Other than above, a 14 point review of systems is negative      Past Medical History:  Diagnosis Date  . Allergy    SEASONAL  . Anemia   . Asthma    allery induced - inhaler rarely used  . Cyst of breast 06/26/2012   left - resolved on f/u per pt  . Diverticulosis   . Family history of bleeding or clotting disorder    ?Von Willebrand Disease: Patient, sister and mother   . Helicobacter pylori gastritis 02/05/2014   EGD 01/2014  . Hyperlipidemia   . MI, old 2004   "stress induced" per pt  . Personal history of colonic polyp - sessile serrated polyp 11/20/2013    Past Surgical History:  Procedure Laterality Date  . ANTERIOR CERVICAL DECOMP/DISCECTOMY FUSION  07/06/2012   Procedure: ANTERIOR CERVICAL DECOMPRESSION/DISCECTOMY FUSION 1 LEVEL;  Surgeon: Winfield Cunas, MD;  Location: [place] [facility];  Service: Neurosurgery;  Laterality: N/A;  Cervical six-seven anterior cervical decompression with fusion plating and bonegraft  . BACK SURGERY  2011   LOW BACK  . CESAREAN SECTION  1988  . COLONOSCOPY     with snare polypectomy  . WISDOM TOOTH EXTRACTION      Allergies as of 01/19/2021   No Known Allergies     Medication List    as of January 19, 2021 11:59 PM   You have not been prescribed any medications.        Objective:   Physical Exam BP 121/70 (BP Location: Right Arm, Patient Position: Sitting, Cuff Size: Large)   Pulse 75   Temp (!) 97 F (36.1 C) (Temporal)   Ht 5\' 8"  (1.727 m)   Wt 177 lb (80.3 kg)   LMP 06/26/2010   SpO2 98%   BMI 26.91 kg/m   General: Well developed, NAD, BMI noted Neck: No  thyromegaly  HEENT:  Normocephalic . Face symmetric, atraumatic Lungs:  CTA B Normal respiratory effort, no intercostal retractions, no accessory muscle use. Heart: RRR,  no murmur.  Abdomen:  Not distended, soft, non-tender. No rebound or rigidity.   Lower extremities: no pretibial edema bilaterally  Skin: R calf, has an area where the skin is a slightly dry and red in a U-shaped. MSK: R shoulder normal L shoulder: Symmetric, no TTP, mild pain elicited with arm elevation. Neurologic:  alert & oriented X3.  Speech normal, gait appropriate for age and unassisted Strength symmetric and appropriate for age.  Psych: Cognition and judgment appear intact.  Cooperative with normal attention span and concentration.  Behavior appropriate. No anxious or depressed appearing.     Assessment       ASSESSMENT new patient to me 11-2018, transferring due to location Asthma PFTs 02/2014 SPIROMETRY: Severe Airflow Limitation. Significant improvement in airflows after bronchodilator Allergic rhinitis History of asbestos exposure Menopause : age? EGD 02/01/2014: + H. pylori, treated. Tinnitus  Saw Dr Arnette Norris 03/2020  PLAN Here for CPX Asthma: Not an issue at this point Rash: See description  above, fungal?  Recommend OTCs, see AVS L shoulder pain: Tendinitis?  Rec to keep the area active, stretching, judicious use of OTCs, call if no better. RTC 1 year  This visit occurred during the SARS-CoV-2 public health emergency.  Safety protocols were in place, including screening questions prior to the visit, additional usage of staff PPE, and extensive cleaning of exam room while observing appropriate contact time as indicated for disinfecting solutions. d

## 2021-01-20 ENCOUNTER — Encounter: Payer: Self-pay | Admitting: Internal Medicine

## 2021-01-20 NOTE — Assessment & Plan Note (Signed)
Here for CPX Asthma: Not an issue at this point Rash: See description above, fungal?  Recommend OTCs, see AVS L shoulder pain: Tendinitis?  Rec to keep the area active, stretching, judicious use of OTCs, call if no better. RTC 1 year

## 2021-01-20 NOTE — Assessment & Plan Note (Signed)
-  Td 2013 -PNM 23: 2013. -Shingrix: Rec  to proceed at her local pharmacy -Covid vaccination x 3, plans to get #4 CCS: Had a colonoscopy 2015 and 01/27/2017, next 2023 Female care: Gyn 05/10/2019 had a PAP ([facility]),  normal DEXA (2020 @ gyn) MMG 02/2020 ([facility]) - Rec calcium and vitamin D -Diet exercise : doing great! - Last LDL 170, her overall risk factor is low at 5.6%.  Nevertheless, would like to see LDL slightly better. - Labs: Check a CMP, FLP, CBC

## 2021-03-03 ENCOUNTER — Other Ambulatory Visit (HOSPITAL_BASED_OUTPATIENT_CLINIC_OR_DEPARTMENT_OTHER): Payer: Self-pay

## 2021-03-03 ENCOUNTER — Ambulatory Visit: Payer: Medicare Other | Attending: Internal Medicine

## 2021-03-03 DIAGNOSIS — Z23 Encounter for immunization: Secondary | ICD-10-CM

## 2021-03-03 MED ORDER — MODERNA COVID-19 VACCINE 100 MCG/0.5ML IM SUSP
INTRAMUSCULAR | 0 refills | Status: DC
  Filled 2021-03-03: qty 0.25, 1d supply, fill #0

## 2021-03-03 NOTE — Progress Notes (Signed)
   Covid-19 Vaccination Clinic  Name:  Courtney Larsen    MRN: [id number] DOB: 02-21-1961  03/03/2021  Courtney Larsen was observed post Covid-19 immunization for 15 minutes without incident. She was provided with Vaccine Information Sheet and instruction to access the V-Safe system.   Courtney Larsen was instructed to call 911 with any severe reactions post vaccine: Marland Kitchen Difficulty breathing  . Swelling of face and throat  . A fast heartbeat  . A bad rash all over body  . Dizziness and weakness   Immunizations Administered    Name Date Dose VIS Date Route   Moderna Covid-19 Booster Vaccine 03/03/2021 11:31 AM 0.25 mL 07/16/2020 Intramuscular   Manufacturer: Moderna   Lot: [id number]   [place]: 47829-562-13

## 2021-07-17 ENCOUNTER — Telehealth: Payer: Self-pay | Admitting: Internal Medicine

## 2021-07-17 ENCOUNTER — Encounter: Payer: Self-pay | Admitting: Internal Medicine

## 2021-07-17 NOTE — Telephone Encounter (Signed)
Spoke w/ Pt- informed that I have not seen any paperwork or record requests asking for her medical records for life insurance. I don't see in her chart where she has previously had a MI or stroke. Recommended she call life insurance company back and discuss this with them, I gave her our office fax in case they need any forms completed.

## 2021-07-17 NOTE — Telephone Encounter (Signed)
Pt. Called in and stated that she was in the process of applying for life insurance. Her rep called her and stated they got a report back from her PCP that she has mild cardiac infraction. She stated she was never informed of this and wanted to see if this is true and gain further information.

## 2021-11-20 ENCOUNTER — Telehealth: Payer: Self-pay | Admitting: Internal Medicine

## 2021-11-20 NOTE — Telephone Encounter (Signed)
Left message for patient to call back and schedule Medicare Annual Wellness Visit (AWV) in office.   If not able to come in office, please offer to do virtually or by telephone.  Left office number and my jabber [phone].  Last AWV:09/25/2019  Please schedule at anytime with Nurse Health Advisor.

## 2021-11-24 NOTE — Progress Notes (Addendum)
Subjective:   Courtney Larsen is a 61 y.o. female who presents for Medicare Annual (Subsequent) preventive examination.   I connected with Tami today by telephone and verified that I am speaking with the correct person using two identifiers. Location patient: home Location provider: work Persons participating in the virtual visit: patient, Marine scientist.    I discussed the limitations, risks, security and privacy concerns of performing an evaluation and management service by telephone and the availability of in person appointments. I also discussed with the patient that there may be a patient responsible charge related to this service. The patient expressed understanding and verbally consented to this telephonic visit.    Interactive audio and video telecommunications were attempted between this provider and patient, however failed, due to patient having technical difficulties OR patient did not have access to video capability.  We continued and completed visit with audio only.  Some vital signs may be absent or patient reported.   Time Spent with patient on telephone encounter: 25 minutes  Review of Systems     Cardiac Risk Factors include: none     Objective:    Today's Vitals   11/26/21 0740  Weight: 177 lb (80.3 kg)  Height: 5\' 8"  (1.727 m)   Body mass index is 26.91 kg/m.  Advanced Directives 11/26/2021 09/25/2019 01/13/2017  Does Patient Have a Medical Advance Directive? No Yes No  Type of Advance Directive - [place];Living will -  Does patient want to make changes to medical advance directive? - No - Patient declined -  Copy of [place] in Chart? - Yes - validated most recent copy scanned in chart (See row information) -  Would patient like information on creating a medical advance directive? Yes (MAU/Ambulatory/Procedural Areas - Information given) - -    Current Medications (verified) Outpatient Encounter Medications as of  11/26/2021  Medication Sig   COVID-19 mRNA vaccine, Moderna, (MODERNA COVID-19 VACCINE) 100 MCG/0.5ML injection Inject into the muscle. (Patient not taking: Reported on 11/26/2021)   No facility-administered encounter medications on file as of 11/26/2021.    Allergies (verified) Patient has no known allergies.   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   Cyst of breast 06/26/2012   left - resolved on f/u per pt   Diverticulosis    Family history of bleeding or clotting disorder    ?Von Willebrand Disease: Patient, sister and mother    Helicobacter pylori gastritis 02/05/2014   EGD 01/2014   Hyperlipidemia    Personal history of colonic polyp - sessile serrated polyp 11/20/2013   Past Surgical History:  Procedure Laterality Date   ANTERIOR CERVICAL DECOMP/DISCECTOMY FUSION  07/06/2012   Procedure: ANTERIOR CERVICAL DECOMPRESSION/DISCECTOMY FUSION 1 LEVEL;  Surgeon: Winfield Cunas, MD;  Location: [facility];  Service: Neurosurgery;  Laterality: N/A;  Cervical six-seven anterior cervical decompression with fusion plating and bonegraft   BACK SURGERY  2011   LOW BACK   CESAREAN SECTION  1988   COLONOSCOPY     with snare polypectomy   WISDOM TOOTH EXTRACTION     Family History  Adopted: Yes  Problem Relation Age of Onset   Diabetes Mother    Ulcers Mother    Bleeding Disorder Mother    Early death Mother    Learning disabilities Mother    Heart attack Mother  Cancer Sister 90       BRAIN TUMOR   Breast cancer Sister 68   Heart disease Sister    Bleeding Disorder Sister    Colon cancer Neg Hx    Esophageal cancer Neg Hx    Pancreatic cancer Neg Hx    Rectal cancer Neg Hx    Stomach cancer Neg Hx    Social History   Socioeconomic History   Marital status: Single    Spouse name: Not on file   Number of children: 3   Years of education: 14   Highest education level: Not on file   Occupational History   Occupation: Retired, Designer, industrial/product   Tobacco Use   Smoking status: Never   Smokeless tobacco: Never  Vaping Use   Vaping Use: Never used  Substance and Sexual Activity   Alcohol use: No    Alcohol/week: 0.0 standard drinks   Drug use: No   Sexual activity: Yes    Partners: Male    Comment: 1st intercourse- 33, partners- 33, current partner- 8 yrs  Other Topics Concern   Not on file  Social History Narrative   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   Denies abuse and feels safe at home.    Social Determinants of Health   Financial Resource Strain: Low Risk    Difficulty of Paying Living Expenses: Not hard at all  Food Insecurity: No Food Insecurity   Worried About Charity fundraiser in the Last Year: Never true   [place] in the Last Year: Never true  Transportation Needs: No Transportation Needs   Lack of Transportation (Medical): No   Lack of Transportation (Non-Medical): No  Physical Activity: Inactive   Days of Exercise per Week: 0 days   Minutes of Exercise per Session: 0 min  Stress: No Stress Concern Present   Feeling of Stress : Not at all  Social Connections: Socially Isolated   Frequency of Communication with Friends and Family: Once a week   Frequency of Social Gatherings with Friends and Family: Once a week   Attends Religious Services: Never   Marine scientist or Organizations: No   Attends Music therapist: Never   Marital Status: Never married    Tobacco Counseling Counseling given: Not Answered   Clinical Intake:  Pre-visit preparation completed: Yes  Pain : No/denies pain     BMI - recorded: 26.91 Nutritional Status: BMI 25 -29 Overweight Nutritional Risks: None Diabetes: No  How often do you need to have someone  help you when you read instructions, pamphlets, or other written materials from your doctor or pharmacy?: 1 - Never  Diabetic?No  Interpreter Needed?: No  Information entered by :: Caroleen Hamman LPN   Activities of Daily Living In your present state of health, do you have any difficulty performing the following activities: 11/26/2021  Hearing? N  Vision? N  Difficulty concentrating or making decisions? N  Walking or climbing stairs? N  Dressing or bathing? N  Doing errands, shopping? N  Preparing Food and eating ? N  Using the Toilet? N  In the past six months, have you accidently leaked urine? N  Do you have problems with loss of bowel control? N  Managing your Medications? N  Managing your Finances? N  Housekeeping or managing your Housekeeping? N  Some recent data might be hidden    Patient Care Team: Colon Branch, MD as PCP - General (Internal Medicine) Ashok Pall, MD (Neurosurgery)  Indicate any recent Medical Services you may have received from other than [facility] providers in the past year (date may be approximate).     Assessment:   This is a routine wellness examination for Azarie.  Hearing/Vision screen Hearing Screening - Comments:: C/o mild hearing loss Vision Screening - Comments:: Last eye exam-4-5 years ago  Dietary issues and exercise activities discussed: Current Exercise Habits: The patient does not participate in regular exercise at present, Exercise limited by: None identified   Goals Addressed             This Visit's Progress    Patient Stated   On track    Maintain healthy active lifestyle.      Patient Stated       Increase exercise       Depression Screen PHQ 2/9 Scores 11/26/2021 01/19/2021 09/25/2019 12/18/2018 04/18/2017  PHQ - 2 Score 0 0 0 0 0  PHQ- 9 Score - - - 3 -    Fall Risk Fall Risk  11/26/2021 01/19/2021 09/25/2019 07/05/2019 04/18/2017  Falls in the past year? 0 - 0 0 No  Number falls in past yr: 0 0 - - -  Injury with  Fall? 0 0 - - -  Follow up Falls prevention discussed - Education provided;Falls prevention discussed Falls evaluation completed -    FALL RISK PREVENTION PERTAINING TO THE HOME:  Any stairs in or around the home? Yes  If so, are there any without handrails? No  Home free of loose throw rugs in walkways, pet beds, electrical cords, etc? Yes  Adequate lighting in your home to reduce risk of falls? Yes   ASSISTIVE DEVICES UTILIZED TO PREVENT FALLS:  Life alert? No  Use of a cane, walker or w/c? No  Grab bars in the bathroom? No  Shower chair or bench in shower? No  Elevated toilet seat or a handicapped toilet? No   TIMED UP AND GO:  Was the test performed? No . Phone visit   Cognitive Function:Normal cognitive status assessed by this Nurse Health Advisor. No abnormalities found.          Immunizations Immunization History  Administered Date(s) Administered   Influenza,inj,Quad PF,6+ Mos 06/27/2017, 07/05/2019   Moderna Covid-19 Vaccine Bivalent Booster 37yrs & up 07/24/2021   Moderna SARS-COV2 Booster Vaccination 03/03/2021   Moderna Sars-Covid-2 Vaccination 10/26/2019, 11/23/2019, 09/17/2020   Pneumococcal Polysaccharide-23 07/07/2012   Tdap 06/26/2012    TDAP status: Up to date  Flu Vaccine status: Due, Education has been provided regarding the importance of this vaccine. Advised may receive this vaccine at local pharmacy or Health Dept. Aware to provide a copy of the vaccination record if obtained from local pharmacy or Health Dept. Verbalized acceptance and understanding.  Pneumococcal vaccine status: Up to date  Covid-19 vaccine status: Completed vaccines  Qualifies for Shingles Vaccine? Yes   Zostavax completed No   Shingrix Completed?: No.    Education has been provided regarding the importance of this vaccine. Patient has been advised to call insurance company to determine out of pocket expense if they have not yet received this vaccine. Advised may also  receive vaccine at local pharmacy or Health Dept. Verbalized acceptance and understanding.  Screening Tests Health Maintenance  Topic Date Due  Zoster Vaccines- Shingrix (1 of 2) Never done   INFLUENZA VACCINE  04/27/2021   COLONOSCOPY (Pts 45-26yrs Insurance coverage will need to be confirmed)  01/27/2022   MAMMOGRAM  03/20/2022   PAP SMEAR-Modifier  05/09/2022   TETANUS/TDAP  06/26/2022   COVID-19 Vaccine  Completed   Hepatitis C Screening  Completed   HIV Screening  Completed   HPV VACCINES  Aged Out    Health Maintenance  Health Maintenance Due  Topic Date Due   Zoster Vaccines- Shingrix (1 of 2) Never done   INFLUENZA VACCINE  04/27/2021    Colorectal cancer screening: Type of screening: Colonoscopy. Completed 01/27/2017. Repeat every 5 years  Mammogram status: Ordered today. Pt provided with contact info and advised to call to schedule appt.   Bone Density status: Due at age 23  Lung Cancer Screening: (Low Dose CT Chest recommended if Age 52-80 years, 30 pack-year currently smoking OR have quit w/in 15years.) does not qualify.     Additional Screening:  Hepatitis C Screening: Completed 04/18/2017  Vision Screening: Recommended annual ophthalmology exams for early detection of glaucoma and other disorders of the eye. Is the patient up to date with their annual eye exam?  No  Who is the provider or what is the name of the office in which the patient attends annual eye exams? None-Patient advised to make an appt.   Dental Screening: Recommended annual dental exams for proper oral hygiene  Community Resource Referral / Chronic Care Management: CRR required this visit?  No   CCM required this visit?  No      Plan:     I have personally reviewed and noted the following in the patients chart:   Medical and social history Use of alcohol, tobacco or illicit drugs  Current medications and supplements including opioid prescriptions.  Functional ability and  status Nutritional status Physical activity Advanced directives List of other physicians Hospitalizations, surgeries, and ER visits in previous 12 months Vitals Screenings to include cognitive, depression, and falls Referrals and appointments  In addition, I have reviewed and discussed with patient certain preventive protocols, quality metrics, and best practice recommendations. A written personalized care plan for preventive services as well as general preventive health recommendations were provided to patient.   Due to this being a telephonic visit, the after visit summary with patients personalized plan was offered to patient via mail or my-chart. Per request, patient was mailed a copy of [place], LPN   02/28/1582  Nurse Health Advisor  Nurse Notes: None  I have reviewed and agree with Health Coaches documentation.  Kathlene November, MD

## 2021-11-26 ENCOUNTER — Ambulatory Visit (INDEPENDENT_AMBULATORY_CARE_PROVIDER_SITE_OTHER): Payer: Medicare Other

## 2021-11-26 VITALS — Ht 68.0 in | Wt 177.0 lb

## 2021-11-26 DIAGNOSIS — Z0000 Encounter for general adult medical examination without abnormal findings: Secondary | ICD-10-CM | POA: Diagnosis not present

## 2021-11-26 DIAGNOSIS — Z1231 Encounter for screening mammogram for malignant neoplasm of breast: Secondary | ICD-10-CM

## 2021-11-26 NOTE — Patient Instructions (Signed)
Ms. Courtney Larsen , Thank you for taking time to complete your Medicare Wellness Visit. I appreciate your ongoing commitment to your health goals. Please review the following plan we discussed and let me know if I can assist you in the future.   Screening recommendations/referrals: Colonoscopy: Completed 01/27/2017-Due 01/27/2022 Mammogram: Ordered today. Someone will call you to schedule. Bone Density: Due at age 61 Recommended yearly ophthalmology/optometry visit for glaucoma screening and checkup Recommended yearly dental visit for hygiene and checkup  Vaccinations: Influenza vaccine: Due-May obtain vaccine at our office or your local pharmacy. Pneumococcal vaccine: Up to date Tdap vaccine: Up to date-Due 05/2022 Shingles vaccine: May obtain vaccine at your local pharmacy. Covid-19: Up to date  Advanced directives: Information mailed today  Conditions/risks identified: See problem list  Next appointment: Follow up in one year for your annual wellness visit. 11/01/2022 @ 8:20 (phone visit)  Preventive Care 40-64 Years, Female Preventive care refers to lifestyle choices and visits with your health care provider that can promote health and wellness. What does preventive care include? A yearly physical exam. This is also called an annual well check. Dental exams once or twice a year. Routine eye exams. Ask your health care provider how often you should have your eyes checked. Personal lifestyle choices, including: Daily care of your teeth and gums. Regular physical activity. Eating a healthy diet. Avoiding tobacco and drug use. Limiting alcohol use. Practicing safe sex. Taking low-dose aspirin daily starting at age 15. Taking vitamin and mineral supplements as recommended by your health care provider. What happens during an annual well check? The services and screenings done by your health care provider during your annual well check will depend on your age, overall health, lifestyle risk  factors, and family history of disease. Counseling  Your health care provider may ask you questions about your: Alcohol use. Tobacco use. Drug use. Emotional well-being. Home and relationship well-being. Sexual activity. Eating habits. Work and work Statistician. Method of birth control. Menstrual cycle. Pregnancy history. Screening  You may have the following tests or measurements: Height, weight, and BMI. Blood pressure. Lipid and cholesterol levels. These may be checked every 5 years, or more frequently if you are over 90 years old. Skin check. Lung cancer screening. You may have this screening every year starting at age 82 if you have a 30-pack-year history of smoking and currently smoke or have quit within the past 15 years. Fecal occult blood test (FOBT) of the stool. You may have this test every year starting at age 75. Flexible sigmoidoscopy or colonoscopy. You may have a sigmoidoscopy every 5 years or a colonoscopy every 10 years starting at age 41. Hepatitis C blood test. Hepatitis B blood test. Sexually transmitted disease (STD) testing. Diabetes screening. This is done by checking your blood sugar (glucose) after you have not eaten for a while (fasting). You may have this done every 1-3 years. Mammogram. This may be done every 1-2 years. Talk to your health care provider about when you should start having regular mammograms. This may depend on whether you have a family history of breast cancer. BRCA-related cancer screening. This may be done if you have a family history of breast, ovarian, tubal, or peritoneal cancers. Pelvic exam and Pap test. This may be done every 3 years starting at age 42. Starting at age 41, this may be done every 5 years if you have a Pap test in combination with an HPV test. Bone density scan. This is done to screen for osteoporosis.  You may have this scan if you are at high risk for osteoporosis. Discuss your test results, treatment options, and if  necessary, the need for more tests with your health care provider. Vaccines  Your health care provider may recommend certain vaccines, such as: Influenza vaccine. This is recommended every year. Tetanus, diphtheria, and acellular pertussis (Tdap, Td) vaccine. You may need a Td booster every 10 years. Zoster vaccine. You may need this after age 49. Pneumococcal 13-valent conjugate (PCV13) vaccine. You may need this if you have certain conditions and were not previously vaccinated. Pneumococcal polysaccharide (PPSV23) vaccine. You may need one or two doses if you smoke cigarettes or if you have certain conditions. Talk to your health care provider about which screenings and vaccines you need and how often you need them. This information is not intended to replace advice given to you by your health care provider. Make sure you discuss any questions you have with your health care provider. Document Released: 10/10/2015 Document Revised: 06/02/2016 Document Reviewed: 07/15/2015 Elsevier Interactive Patient Education  2017 [place] Prevention in the Home Falls can cause injuries. They can happen to people of all ages. There are many things you can do to make your home safe and to help prevent falls. What can I do on the outside of my home? Regularly fix the edges of walkways and driveways and fix any cracks. Remove anything that might make you trip as you walk through a door, such as a raised step or threshold. Trim any bushes or trees on the path to your home. Use bright outdoor lighting. Clear any walking paths of anything that might make someone trip, such as rocks or tools. Regularly check to see if handrails are loose or broken. Make sure that both sides of any steps have handrails. Any raised decks and porches should have guardrails on the edges. Have any leaves, snow, or ice cleared regularly. Use sand or salt on walking paths during winter. Clean up any spills in your  garage right away. This includes oil or grease spills. What can I do in the bathroom? Use night lights. Install grab bars by the toilet and in the tub and shower. Do not use towel bars as grab bars. Use non-skid mats or decals in the tub or shower. If you need to sit down in the shower, use a plastic, non-slip stool. Keep the floor dry. Clean up any water that spills on the floor as soon as it happens. Remove soap buildup in the tub or shower regularly. Attach bath mats securely with double-sided non-slip rug tape. Do not have throw rugs and other things on the floor that can make you trip. What can I do in the bedroom? Use night lights. Make sure that you have a light by your bed that is easy to reach. Do not use any sheets or blankets that are too big for your bed. They should not hang down onto the floor. Have a firm chair that has side arms. You can use this for support while you get dressed. Do not have throw rugs and other things on the floor that can make you trip. What can I do in the kitchen? Clean up any spills right away. Avoid walking on wet floors. Keep items that you use a lot in easy-to-reach places. If you need to reach something above you, use a strong step stool that has a grab bar. Keep electrical cords out of the way. Do not use  floor polish or wax that makes floors slippery. If you must use wax, use non-skid floor wax. Do not have throw rugs and other things on the floor that can make you trip. What can I do with my stairs? Do not leave any items on the stairs. Make sure that there are handrails on both sides of the stairs and use them. Fix handrails that are broken or loose. Make sure that handrails are as long as the stairways. Check any carpeting to make sure that it is firmly attached to the stairs. Fix any carpet that is loose or worn. Avoid having throw rugs at the top or bottom of the stairs. If you do have throw rugs, attach them to the floor with carpet  tape. Make sure that you have a light switch at the top of the stairs and the bottom of the stairs. If you do not have them, ask someone to add them for you. What else can I do to help prevent falls? Wear shoes that: Do not have high heels. Have rubber bottoms. Are comfortable and fit you well. Are closed at the toe. Do not wear sandals. If you use a stepladder: Make sure that it is fully opened. Do not climb a closed stepladder. Make sure that both sides of the stepladder are locked into place. Ask someone to hold it for you, if possible. Clearly mark and make sure that you can see: Any grab bars or handrails. First and last steps. Where the edge of each step is. Use tools that help you move around (mobility aids) if they are needed. These include: Canes. Walkers. Scooters. Crutches. Turn on the lights when you go into a dark area. Replace any light bulbs as soon as they burn out. Set up your furniture so you have a clear path. Avoid moving your furniture around. If any of your floors are uneven, fix them. If there are any pets around you, be aware of where they are. Review your medicines with your doctor. Some medicines can make you feel dizzy. This can increase your chance of falling. Ask your doctor what other things that you can do to help prevent falls. This information is not intended to replace advice given to you by your health care provider. Make sure you discuss any questions you have with your health care provider. Document Released: 07/10/2009 Document Revised: 02/19/2016 Document Reviewed: 10/18/2014 Elsevier Interactive Patient Education  2017 [employer].

## 2021-12-23 ENCOUNTER — Encounter: Payer: Self-pay | Admitting: Internal Medicine

## 2021-12-25 ENCOUNTER — Telehealth (HOSPITAL_BASED_OUTPATIENT_CLINIC_OR_DEPARTMENT_OTHER): Payer: Self-pay

## 2021-12-28 DIAGNOSIS — M5440 Lumbago with sciatica, unspecified side: Secondary | ICD-10-CM | POA: Diagnosis not present

## 2022-01-21 ENCOUNTER — Encounter: Payer: Medicare Other | Admitting: Internal Medicine

## 2022-01-21 ENCOUNTER — Ambulatory Visit (INDEPENDENT_AMBULATORY_CARE_PROVIDER_SITE_OTHER): Payer: Medicare Other | Admitting: Internal Medicine

## 2022-01-21 ENCOUNTER — Encounter: Payer: Self-pay | Admitting: Internal Medicine

## 2022-01-21 VITALS — BP 122/80 | HR 57 | Temp 98.0°F | Resp 16 | Ht 68.0 in | Wt 177.0 lb

## 2022-01-21 DIAGNOSIS — Z01419 Encounter for gynecological examination (general) (routine) without abnormal findings: Secondary | ICD-10-CM

## 2022-01-21 DIAGNOSIS — E559 Vitamin D deficiency, unspecified: Secondary | ICD-10-CM

## 2022-01-21 DIAGNOSIS — D649 Anemia, unspecified: Secondary | ICD-10-CM | POA: Diagnosis not present

## 2022-01-21 DIAGNOSIS — E785 Hyperlipidemia, unspecified: Secondary | ICD-10-CM | POA: Diagnosis not present

## 2022-01-21 DIAGNOSIS — Z0000 Encounter for general adult medical examination without abnormal findings: Secondary | ICD-10-CM

## 2022-01-21 DIAGNOSIS — Z780 Asymptomatic menopausal state: Secondary | ICD-10-CM | POA: Diagnosis not present

## 2022-01-21 DIAGNOSIS — Z1231 Encounter for screening mammogram for malignant neoplasm of breast: Secondary | ICD-10-CM

## 2022-01-21 LAB — CBC WITH DIFFERENTIAL/PLATELET
Basophils Absolute: 0 10*3/uL (ref 0.0–0.1)
Basophils Relative: 0.8 % (ref 0.0–3.0)
Eosinophils Absolute: 0.2 10*3/uL (ref 0.0–0.7)
Eosinophils Relative: 4.5 % (ref 0.0–5.0)
HCT: 38.7 % (ref 36.0–46.0)
Hemoglobin: 12.4 g/dL (ref 12.0–15.0)
Lymphocytes Relative: 39.9 % (ref 12.0–46.0)
Lymphs Abs: 1.5 10*3/uL (ref 0.7–4.0)
MCHC: 32.1 g/dL (ref 30.0–36.0)
MCV: 90.8 fl (ref 78.0–100.0)
Monocytes Absolute: 0.3 10*3/uL (ref 0.1–1.0)
Monocytes Relative: 8.1 % (ref 3.0–12.0)
Neutro Abs: 1.8 10*3/uL (ref 1.4–7.7)
Neutrophils Relative %: 46.7 % (ref 43.0–77.0)
Platelets: 208 10*3/uL (ref 150.0–400.0)
RBC: 4.26 Mil/uL (ref 3.87–5.11)
RDW: 14.5 % (ref 11.5–15.5)
WBC: 3.8 10*3/uL — ABNORMAL LOW (ref 4.0–10.5)

## 2022-01-21 LAB — COMPREHENSIVE METABOLIC PANEL
ALT: 13 U/L (ref 0–35)
AST: 15 U/L (ref 0–37)
Albumin: 4.3 g/dL (ref 3.5–5.2)
Alkaline Phosphatase: 64 U/L (ref 39–117)
BUN: 16 mg/dL (ref 6–23)
CO2: 31 mEq/L (ref 19–32)
Calcium: 9.1 mg/dL (ref 8.4–10.5)
Chloride: 104 mEq/L (ref 96–112)
Creatinine, Ser: 0.76 mg/dL (ref 0.40–1.20)
GFR: 83.92 mL/min (ref >60.00)
Glucose, Bld: 86 mg/dL (ref 70–99)
Potassium: 4.2 mEq/L (ref 3.5–5.1)
Sodium: 139 mEq/L (ref 135–145)
Total Bilirubin: 0.5 mg/dL (ref 0.2–1.2)
Total Protein: 7.2 g/dL (ref 6.0–8.3)

## 2022-01-21 LAB — IRON: Iron: 83 ug/dL (ref 42–145)

## 2022-01-21 LAB — VITAMIN D 25 HYDROXY (VIT D DEFICIENCY, FRACTURES): VITD: 19.28 ng/mL — ABNORMAL LOW (ref 30.00–100.00)

## 2022-01-21 LAB — LIPID PANEL
Cholesterol: 267 mg/dL — ABNORMAL HIGH (ref 0–200)
HDL: 69 mg/dL (ref >39.00)
LDL Cholesterol: 187 mg/dL — ABNORMAL HIGH (ref 0–99)
NonHDL: 197.6
Total CHOL/HDL Ratio: 4
Triglycerides: 52 mg/dL (ref 0.0–149.0)
VLDL: 10.4 mg/dL (ref 0.0–40.0)

## 2022-01-21 LAB — FERRITIN: Ferritin: 53.4 ng/mL (ref 10.0–291.0)

## 2022-01-21 LAB — TSH: TSH: 0.42 u[IU]/mL (ref 0.35–5.50)

## 2022-01-21 NOTE — Patient Instructions (Signed)
Vaccines I recommend: ?Tdap with a whooping cough ?Shingrix ? ?You are due for a colonoscopy, reach out to gastroenterology to schedule.  Phone number is: [phone] ? ?Recommend vitamin D3: 2000 units daily ? ?GO TO THE LAB : Get the blood work   ? ? ?[place], [place] ?Come back for a physical exam in 1 year ? ?You can schedule your mammogram at the first floor. ? ? ? ? ?"Living will", "Health Care Power of attorney": Advanced care planning ? ?(If you already have a living will or healthcare power of attorney, please bring the copy to be scanned in your chart.) ? ?Advance care planning is a process that supports adults in  understanding and sharing their preferences regarding future medical care.  ? ?The patient's preferences are recorded in documents called Advance Directives.    ?Advanced directives are completed (and can be modified at any time) while the patient is in full mental capacity.  ? ?The documentation should be available at all times to the patient, the family and the healthcare providers.  ?Bring in a copy to be scanned in your chart is an excellent idea and is recommended  ? ?This legal documents direct treatment decision making and/or appoint a surrogate to make the decision if the patient is not capable to do so.  ? ? ?Advance directives can be documented in many types of formats,  documents have names such as:  ?Lliving will  ?Durable power of attorney for healthcare (healthcare proxy or healthcare power of attorney)  ?Combined directives  ?Physician orders for life-sustaining treatment  ?  ?More information at: ? ?[email or web address]  ?

## 2022-01-21 NOTE — Progress Notes (Signed)
? ?Subjective:  ? ? Patient ID: Courtney Larsen, female    DOB: Apr 03, 1961, 61 y.o.   MRN: [id number] ? ?DOS:  01/21/2022 ?Type of visit - description: CPX ? ?Since the last office visit is doing well.  Has no major concerns. ?Has chronic, stable tinnitus without HOH ? ?Review of Systems ? ?Other than above, a 14 point review of systems is negative  ? ? ? ?Past Medical History:  ?Diagnosis Date  ? Allergy   ? SEASONAL  ? Anemia   ? Asthma   ? allery induced - inhaler rarely used  ? Cyst of breast 06/26/2012  ? left - resolved on f/u per pt  ? Diverticulosis   ? Family history of bleeding or clotting disorder   ? ?Von Willebrand Disease: Patient, sister and mother   ? Helicobacter pylori gastritis 02/05/2014  ? EGD 01/2014  ? Hyperlipidemia   ? Personal history of colonic polyp - sessile serrated polyp 11/20/2013  ? ? ?Past Surgical History:  ?Procedure Laterality Date  ? ANTERIOR CERVICAL DECOMP/DISCECTOMY FUSION  07/06/2012  ? Procedure: ANTERIOR CERVICAL DECOMPRESSION/DISCECTOMY FUSION 1 LEVEL;  Surgeon: [place] Cunas, MD;  Location: [facility];  Service: Neurosurgery;  Laterality: N/A;  Cervical six-seven anterior cervical decompression with fusion plating and bonegraft  ? BACK SURGERY  2011  ? LOW BACK  ? [place]  ? COLONOSCOPY    ? with snare polypectomy  ? WISDOM TOOTH EXTRACTION    ? ?Social History  ? ?Socioeconomic History  ? Marital status: Single  ?  Spouse name: Not on file  ? Number of children: 3  ? Years of education: 55  ? Highest education level: Not on file  ?Occupational History  ? Occupation: Retired, Designer, industrial/product   ?Tobacco Use  ? Smoking status: Never  ? Smokeless tobacco: Never  ?Vaping Use  ? Vaping Use: Never used  ?Substance and Sexual Activity  ? Alcohol use: No  ?  Alcohol/week: 0.0 standard drinks  ? Drug use: No  ? Sexual activity: Yes  ?  Partners: Male  ?  Comment: 1st intercourse- 50, partners- 57, current partner- 15 yrs  ?Other Topics Concern  ? Not on file   ?Social History Narrative  ? 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 Move from [place] to [facility] to [place] in 2013  ? Lives with fiancee  ? Children: 3 ([address] [place], [place])  ? Fun: Hanging out with friends, outdoors, eating and cooking.  ? Denies abuse and feels safe at home.   ? ?Social Determinants of Health  ? ?Financial Resource Strain: Low Risk   ? Difficulty of Paying Living Expenses: Not hard at all  ?Food Insecurity: No Food Insecurity  ? Worried About Charity fundraiser in the Last Year: Never true  ? Ran Out of Food in the Last Year: Never true  ?Transportation Needs: No Transportation Needs  ? Lack of Transportation (Medical): No  ? Lack of Transportation (Non-Medical): No  ?Physical Activity: Inactive  ? Days of Exercise per Week: 0 days  ? Minutes of Exercise per Session: 0 min  ?Stress: No Stress Concern Present  ? Feeling of Stress : Not at all  ?Social Connections: Socially Isolated  ? Frequency of Communication with Friends and Family: Once a week  ? Frequency of Social Gatherings with Friends and Family: Once a week  ? Attends Religious Services: Never  ? Active Member of Clubs or Organizations: No  ?  Attends Archivist Meetings: Never  ? Marital Status: Never married  ?Intimate Partner Violence: Not At Risk  ? Fear of Current or Ex-Partner: No  ? Emotionally Abused: No  ? Physically Abused: No  ? Sexually Abused: No  ? ? ?No current outpatient medications ? ?   ?Objective:  ? Physical Exam ?BP 122/80 (BP Location: Left Arm, Patient Position: Sitting, Cuff Size: Small)   Pulse (!) 57   Temp 98 ?F (36.7 ?C) (Oral)   Resp 16   Ht '5\' 8"'$  (1.727 m)   Wt 177 lb (80.3 kg)   LMP 06/26/2010   SpO2 98%   BMI 26.91 kg/m?  ?General: ?Well developed, NAD, BMI noted ?Neck: No  thyromegaly  ?HEENT:  ?Normocephalic . Face symmetric, atraumatic ?Lungs:  ?CTA B ?Normal respiratory effort, no intercostal retractions, no accessory muscle use. ?Heart: RRR,  no murmur.  ?Abdomen:  ?Not distended,  soft, non-tender. No rebound or rigidity.   ?Lower extremities: no pretibial edema bilaterally  ?Skin: Exposed areas without rash. Not pale. Not jaundice ?Neurologic:  ?alert & oriented X3.  ?Speech normal, gait appropriate for age and unassisted ?Strength symmetric and appropriate for age.  ?Psych: ?Cognition and judgment appear intact.  ?Cooperative with normal attention span and concentration.  ?Behavior appropriate. ?No anxious or depressed appearing. ? ?   ?Assessment   ? ?ASSESSMENT new patient to me 11-2018, transferring due to location ?Asthma ?PFTs 02/2014 SPIROMETRY: Severe Airflow Limitation. Significant improvement in airflows after bronchodilator ?Allergic rhinitis ?History of asbestos exposure ?Menopause : age? ?EGD 02/01/2014: + H. pylori, treated. ?Tinnitus  Saw Dr Arnette Norris 03/2020 ? ?PLAN ?Here for CPX ?Asthma and allergies: Not an issue at this point. ?Tinnitus: Stable.  No decreased hearing. ?RTC 1 year ? ?

## 2022-01-22 ENCOUNTER — Encounter: Payer: Self-pay | Admitting: Internal Medicine

## 2022-01-22 MED ORDER — VITAMIN D (ERGOCALCIFEROL) 1.25 MG (50000 UNIT) PO CAPS: 50000.0000 [IU] | ORAL_CAPSULE | ORAL | 0 refills | Status: DC

## 2022-01-22 NOTE — Assessment & Plan Note (Signed)
-  Td 2013.  Recommend Tdap ?- PNM 23: 2013. ?-Shingrix: Rec to proceed ?-Covid vax- utd ?CCS: Had a colonoscopy 2015 and 01/27/2017, next is due, recommend to contact GI.  See AVS ?Female care: Gyn 05/10/2019 had a PAP ([facility]),  needs a new gyn referral sent ?Normal DEXA (2020 @ gyn) ?MMG : order sent  ?-Lifestyle: Used to exercise more, plans to go back and take routine walks.  Eats healthy.  Recommend vitamin D supplements ?- Labs: CMP, FLP, CBC, iron, ferritin, TSH, vitamin D ?-ACP discussed ?  ?

## 2022-01-22 NOTE — Assessment & Plan Note (Signed)
Here for CPX ?Asthma and allergies: Not an issue at this point. ?Tinnitus: Stable.  No decreased hearing. ?RTC 1 year ?

## 2022-01-22 NOTE — Addendum Note (Signed)
Addended byDamita Dunnings D on: 01/22/2022 01:22 PM ? ? Modules accepted: Orders ? ?

## 2022-01-25 ENCOUNTER — Inpatient Hospital Stay (HOSPITAL_BASED_OUTPATIENT_CLINIC_OR_DEPARTMENT_OTHER): Admission: RE | Admit: 2022-01-25 | Payer: Medicare Other | Source: Ambulatory Visit

## 2022-02-25 ENCOUNTER — Ambulatory Visit: Payer: Medicare Other

## 2022-02-26 ENCOUNTER — Ambulatory Visit: Payer: Medicare Other | Admitting: Internal Medicine

## 2022-02-26 ENCOUNTER — Encounter: Payer: Self-pay | Admitting: Internal Medicine

## 2022-02-26 ENCOUNTER — Ambulatory Visit
Admission: RE | Admit: 2022-02-26 | Discharge: 2022-02-26 | Disposition: A | Discharge status: Home / Self Care | Payer: Medicare Other | Source: Ambulatory Visit | Attending: Internal Medicine | Admitting: Internal Medicine

## 2022-02-26 DIAGNOSIS — Z1231 Encounter for screening mammogram for malignant neoplasm of breast: Secondary | ICD-10-CM | POA: Diagnosis not present

## 2022-03-02 ENCOUNTER — Other Ambulatory Visit: Payer: Self-pay | Admitting: Internal Medicine

## 2022-03-02 DIAGNOSIS — R928 Other abnormal and inconclusive findings on diagnostic imaging of breast: Secondary | ICD-10-CM

## 2022-03-04 ENCOUNTER — Telehealth: Payer: Self-pay | Admitting: Internal Medicine

## 2022-03-04 NOTE — Telephone Encounter (Signed)
Pt called stating that she had not received her lab results in the mail from 4.27.23. Pt's address was verified as correct and would like that sent when possible.

## 2022-03-05 NOTE — Telephone Encounter (Signed)
Results mailed 

## 2022-03-09 ENCOUNTER — Ambulatory Visit
Admission: RE | Admit: 2022-03-09 | Discharge: 2022-03-09 | Disposition: A | Discharge status: Home / Self Care | Payer: Medicare Other | Source: Ambulatory Visit | Attending: Internal Medicine | Admitting: Internal Medicine

## 2022-03-09 ENCOUNTER — Other Ambulatory Visit: Payer: Self-pay | Admitting: Internal Medicine

## 2022-03-09 DIAGNOSIS — N6012 Diffuse cystic mastopathy of left breast: Secondary | ICD-10-CM | POA: Diagnosis not present

## 2022-03-09 DIAGNOSIS — N6320 Unspecified lump in the left breast, unspecified quadrant: Secondary | ICD-10-CM

## 2022-03-09 DIAGNOSIS — R928 Other abnormal and inconclusive findings on diagnostic imaging of breast: Secondary | ICD-10-CM | POA: Diagnosis not present

## 2022-03-12 LAB — HM PAP SMEAR

## 2022-03-12 LAB — RESULTS CONSOLE HPV: CHL HPV: NEGATIVE

## 2022-04-03 ENCOUNTER — Encounter: Payer: Self-pay | Admitting: Internal Medicine

## 2022-04-05 ENCOUNTER — Telehealth: Payer: Self-pay | Admitting: Internal Medicine

## 2022-04-05 NOTE — Telephone Encounter (Signed)
Please advise 

## 2022-04-05 NOTE — Telephone Encounter (Signed)
Send a letter Courtney Larsen is a patient of mine. I reviewed her chart at her request and I do not have any evidence that she ever was diagnosed with myocardial infarction. Please reach out if you have questions.

## 2022-04-05 NOTE — Telephone Encounter (Signed)
Letter mailed to [place] at [phone]. Letter also mailed to Pt.

## 2022-04-05 NOTE — Telephone Encounter (Signed)
Received fax confirmation

## 2022-04-05 NOTE — Telephone Encounter (Signed)
Pt stated [employer] states they have a diagnosis of myocardial infractions, and this effects her life insurance. She stated she needs a letter sent to them stating she does not have this. [phone] and [phone]. She would like a copy mailed to her as well. Please advise.

## 2022-04-05 NOTE — Telephone Encounter (Signed)
Pt called to check on status of request. Updated her that letter had been faxed with a confirmation of receipt. Informed her that one has also been mailed to her. Pt very appreciative.

## 2022-04-17 ENCOUNTER — Other Ambulatory Visit: Payer: Self-pay | Admitting: Internal Medicine

## 2022-05-10 ENCOUNTER — Encounter: Payer: Self-pay | Admitting: Internal Medicine

## 2022-05-24 ENCOUNTER — Encounter: Payer: Self-pay | Admitting: Internal Medicine

## 2022-05-26 ENCOUNTER — Encounter: Payer: Self-pay | Admitting: Internal Medicine

## 2022-06-08 ENCOUNTER — Encounter: Payer: Self-pay | Admitting: Internal Medicine

## 2022-06-29 ENCOUNTER — Ambulatory Visit (AMBULATORY_SURGERY_CENTER): Payer: Self-pay | Admitting: *Deleted

## 2022-06-29 VITALS — Ht 69.0 in | Wt 177.0 lb

## 2022-06-29 DIAGNOSIS — Z86010 Personal history of colonic polyps: Secondary | ICD-10-CM

## 2022-06-29 NOTE — Progress Notes (Signed)
No egg or soy allergy known to patient  No issues known to pt with past sedation with any surgeries or procedures Patient denies ever being told they had issues or difficulty with intubation  No FH of Malignant Hyperthermia Pt is not on diet pills Pt is not on  home 02  Pt is not on blood thinners  Pt denies issues with constipation  No A fib or A flutter Have any cardiac testing pending--no Pt instructed to use [email or web address] or GoodRx for a price reduction on prep   

## 2022-07-08 ENCOUNTER — Encounter: Payer: Self-pay | Admitting: Internal Medicine

## 2022-07-15 NOTE — Progress Notes (Signed)
[facility] History and Physical   Primary Care Physician:  Colon Branch, MD   Reason for Procedure:  History of colon polyp  Plan:    Colonoscopy     HPI: Courtney Larsen is a 61 y.o. female  10/2013 - 1 cm sessile serrated polyp  - repeat colonoscopy 01/2017 IC valve ? Polyp NO - recall 2023  Past Medical History:  Diagnosis Date   Allergy    SEASONAL   Anemia    Asthma    allery induced - inhaler rarely used   Cyst of breast 06/26/2012   left - resolved on f/u per pt   Diverticulosis    Family history of bleeding or clotting disorder    ?Von Willebrand Disease: Patient, sister and mother    Helicobacter pylori gastritis 02/05/2014   EGD 01/2014   Hyperlipidemia    Personal history of colonic polyp - sessile serrated polyp 11/20/2013    Past Surgical History:  Procedure Laterality Date   ANTERIOR CERVICAL DECOMP/DISCECTOMY FUSION  07/06/2012   Procedure: ANTERIOR CERVICAL DECOMPRESSION/DISCECTOMY FUSION 1 LEVEL;  Surgeon: [place] Cunas, MD;  Location: [facility];  Service: Neurosurgery;  Laterality: N/A;  Cervical six-seven anterior cervical decompression with fusion plating and bonegraft   BACK SURGERY  2011   [place]   COLONOSCOPY  01/27/2017   with snare polypectomy   WISDOM TOOTH EXTRACTION      Prior to Admission medications   Medication Sig Start Date End Date Taking? Authorizing Provider  Vitamin D, Ergocalciferol, (DRISDOL) 1.25 MG (50000 UNIT) CAPS capsule Take 1 capsule (50,000 Units total) by mouth every 7 (seven) days. Patient not taking: Reported on 06/29/2022 01/22/22   Colon Branch, MD    Current Outpatient Medications  Medication Sig Dispense Refill   Vitamin D, Ergocalciferol, (DRISDOL) 1.25 MG (50000 UNIT) CAPS capsule Take 1 capsule (50,000 Units total) by mouth every 7 (seven) days. (Patient not taking: Reported on 06/29/2022) 12 capsule 0   Current Facility-Administered Medications  Medication Dose  Route Frequency Provider Last Rate Last Admin   0.9 %  sodium chloride infusion  500 mL Intravenous Once Gatha Mayer, MD        Allergies as of 07/16/2022   (No Known Allergies)    Family History  Adopted: Yes  Problem Relation Age of Onset   Diabetes Mother    Ulcers Mother    Bleeding Disorder Mother    Early death Mother    Learning disabilities Mother    Heart attack Mother        late in life   Cancer Sister 21       BRAIN TUMOR   Breast cancer Sister 27   Heart disease Sister    Bleeding Disorder Sister    Colon cancer Neg Hx    Esophageal cancer Neg Hx    Pancreatic cancer Neg Hx    Rectal cancer Neg Hx    Stomach cancer Neg Hx     Social History   Socioeconomic History   Marital status: Single    Spouse name: Not on file   Number of children: 3   Years of education: 14   Highest education level: Not on file  Occupational History   Occupation: Retired, Designer, industrial/product   Tobacco Use   Smoking status: Never   Smokeless tobacco: Never  Vaping Use   Vaping Use: Never used  Substance and Sexual Activity   Alcohol use:  No    Alcohol/week: 0.0 standard drinks of alcohol   Drug use: No   Sexual activity: Yes    Partners: Male    Birth control/protection: Post-menopausal    Comment: 1st intercourse- 14, partners- 61, current partner- 15 yrs  Other Topics Concern   Not on file  Social History Narrative   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   Denies abuse and feels safe at home.    Social Determinants of Health   Financial Resource Strain: Low Risk  (11/26/2021)   Overall Financial Resource Strain (CARDIA)    Difficulty of Paying Living Expenses: Not hard at all  Food Insecurity: No Food Insecurity (11/26/2021)   Hunger Vital Sign    Worried About Running Out of Food in the Last  Year: Never true    Ran Out of Food in the Last Year: Never true  Transportation Needs: No Transportation Needs (11/26/2021)   PRAPARE - Hydrologist (Medical): No    Lack of Transportation (Non-Medical): No  Physical Activity: Inactive (11/26/2021)   Exercise Vital Sign    Days of Exercise per Week: 0 days    Minutes of Exercise per Session: 0 min  Stress: No Stress Concern Present (11/26/2021)   Teller    Feeling of Stress : Not at all  Social Connections: Socially Isolated (11/26/2021)   Social Connection and Isolation Panel [NHANES]    Frequency of Communication with Friends and Family: Once a week    Frequency of Social Gatherings with Friends and Family: Once a week    Attends Religious Services: Never    Marine scientist or Organizations: No    Attends Archivist Meetings: Never    Marital Status: Never married  Intimate Partner Violence: Not At Risk (11/26/2021)   Humiliation, Afraid, Rape, and Kick questionnaire    Fear of Current or Ex-Partner: No    Emotionally Abused: No    Physically Abused: No    Sexually Abused: No    Review of Systems:  All other review of systems negative except as mentioned in the HPI.  Physical Exam: Vital signs BP 122/68   Pulse 78   Temp (!) 97.3 F (36.3 C) (Temporal)   Ht '5\' 8"'$  (1.727 m)   Wt 177 lb (80.3 kg)   LMP 06/26/2010   SpO2 100%   BMI 26.91 kg/m   General:   Alert,  Well-developed, well-nourished, pleasant and cooperative in NAD Lungs:  Clear throughout to auscultation.   Heart:  Regular rate and rhythm; no murmurs, clicks, rubs,  or gallops. Abdomen:  Soft, nontender and nondistended. Normal bowel sounds.   Neuro/Psych:  Alert and cooperative. Normal mood and affect. A and O x 3   '@Talmage Teaster'$  Simonne Maffucci, MD, [facility] [phone] (pager) 07/16/2022 8:04 AM@

## 2022-07-16 ENCOUNTER — Ambulatory Visit (AMBULATORY_SURGERY_CENTER): Payer: Medicare Other | Admitting: Internal Medicine

## 2022-07-16 ENCOUNTER — Encounter: Payer: Self-pay | Admitting: Internal Medicine

## 2022-07-16 VITALS — BP 120/63 | HR 70 | Temp 97.3°F | Resp 14 | Ht 68.0 in | Wt 177.0 lb

## 2022-07-16 DIAGNOSIS — Z86010 Personal history of colonic polyps: Secondary | ICD-10-CM

## 2022-07-16 DIAGNOSIS — Z09 Encounter for follow-up examination after completed treatment for conditions other than malignant neoplasm: Secondary | ICD-10-CM

## 2022-07-16 MED ORDER — SODIUM CHLORIDE 0.9 % IV SOLN: 500.0000 mL | Freq: Once | INTRAVENOUS | Status: DC

## 2022-07-16 NOTE — Op Note (Signed)
[facility] Patient Name: Courtney Larsen Procedure Date: 07/16/2022 7:28 AM MRN: [id number] Endoscopist: Gatha Mayer , MD Age: 61 Referring MD:  Date of Birth: 1961/07/13 Gender: Female Account #: [id number] Procedure:                Colonoscopy Indications:              High risk colon cancer surveillance: Personal                            history of sessile serrated colon polyp (10 mm or                            greater in size), Last colonoscopy: 2018 Medicines:                Monitored Anesthesia Care Procedure:                Pre-Anesthesia Assessment:                           - Prior to the procedure, a History and Physical                            was performed, and patient medications and                            allergies were reviewed. The patient's tolerance of                            previous anesthesia was also reviewed. The risks                            and benefits of the procedure and the sedation                            options and risks were discussed with the patient.                            All questions were answered, and informed consent                            was obtained. Prior Anticoagulants: The patient has                            taken no previous anticoagulant or antiplatelet                            agents. ASA Grade Assessment: II - A patient with                            mild systemic disease. After reviewing the risks                            and benefits, the patient was deemed in  satisfactory condition to undergo the procedure.                           After obtaining informed consent, the colonoscope                            was passed under direct vision. Throughout the                            procedure, the patient's blood pressure, pulse, and                            oxygen saturations were monitored continuously. The                            [id number]-HQ190L  Colonoscope was introduced through the                            anus and advanced to the the cecum, identified by                            appendiceal orifice and ileocecal valve. The                            colonoscopy was performed without difficulty. The                            patient tolerated the procedure well. The quality                            of the bowel preparation was good. The bowel                            preparation used was Miralax via split dose                            instruction. The ileocecal valve, appendiceal                            orifice, and rectum were photographed. Scope In: 8:16:45 AM Scope Out: 8:31:59 AM Scope Withdrawal Time: 0 hours 10 minutes 42 seconds  Total Procedure Duration: 0 hours 15 minutes 14 seconds  Findings:                 The perianal and digital rectal examinations were                            normal.                           Multiple small and large-mouthed diverticula were                            found in the sigmoid colon.  The exam was otherwise without abnormality on                            direct and retroflexion views. Complications:            No immediate complications. Estimated Blood Loss:     Estimated blood loss: none. Impression:               - Diverticulosis in the sigmoid colon.                           - The examination was otherwise normal on direct                            and retroflexion views.                           - No specimens collected.                           - Personal history of colonic polyp 1 cm ssp 2015,                            no polyps 2018. Recommendation:           - Patient has a contact number available for                            emergencies. The signs and symptoms of potential                            delayed complications were discussed with the                            patient. Return to normal activities tomorrow.                             Written discharge instructions were provided to the                            patient.                           - Resume previous diet.                           - Continue present medications.                           - Repeat colonoscopy in 7 years for surveillance. Gatha Mayer, MD 07/16/2022 8:37:45 AM This report has been signed electronically.

## 2022-07-16 NOTE — Progress Notes (Signed)
Sedate, gd SR, tolerated procedure well, VSS, report to RN 

## 2022-07-16 NOTE — Patient Instructions (Addendum)
No polyps again today. I recommend repeat colonoscopy in 7 years.  I appreciate the opportunity to care for you. Gatha Mayer, MD, FACG   YOU HAD AN ENDOSCOPIC PROCEDURE TODAY AT [facility]:   Refer to the procedure report that was given to you for any specific questions about what was found during the examination.  If the procedure report does not answer your questions, please call your gastroenterologist to clarify.  If you requested that your care partner not be given the details of your procedure findings, then the procedure report has been included in a sealed envelope for you to review at your convenience later.  YOU SHOULD EXPECT: Some feelings of bloating in the abdomen. Passage of more gas than usual.  Walking can help get rid of the air that was put into your GI tract during the procedure and reduce the bloating. If you had a lower endoscopy (such as a colonoscopy or flexible sigmoidoscopy) you may notice spotting of blood in your stool or on the toilet paper. If you underwent a bowel prep for your procedure, you may not have a normal bowel movement for a few days.  Please Note:  You might notice some irritation and congestion in your nose or some drainage.  This is from the oxygen used during your procedure.  There is no need for concern and it should clear up in a day or so.  SYMPTOMS TO REPORT IMMEDIATELY:  Following lower endoscopy (colonoscopy or flexible sigmoidoscopy):  Excessive amounts of blood in the stool  Significant tenderness or worsening of abdominal pains  Swelling of the abdomen that is new, acute  Fever of 100F or higher   For urgent or emergent issues, a gastroenterologist can be reached at any hour by calling [phone]. Do not use MyChart messaging for urgent concerns.    DIET:  We do recommend a small meal at first, but then you may proceed to your regular diet.  Drink plenty of fluids but you should avoid alcoholic beverages for 24  hours.  ACTIVITY:  You should plan to take it easy for the rest of today and you should NOT DRIVE or use heavy machinery until tomorrow (because of the sedation medicines used during the test).    FOLLOW UP: Our staff will call the number listed on your records the next business day following your procedure.  We will call around 7:15- 8:00 am to check on you and address any questions or concerns that you may have regarding the information given to you following your procedure. If we do not reach you, we will leave a message.     If any biopsies were taken you will be contacted by phone or by letter within the next 1-3 weeks.  Please call us at [phone] if you have not heard about the biopsies in 3 weeks.    SIGNATURES/CONFIDENTIALITY: You and/or your care partner have signed paperwork which will be entered into your electronic medical record.  These signatures attest to the fact that that the information above on your After Visit Summary has been reviewed and is understood.  Full responsibility of the confidentiality of this discharge information lies with you and/or your care-partner.

## 2022-07-16 NOTE — Progress Notes (Signed)
Pt's states no medical or surgical changes since previsit or office visit. 

## 2022-07-19 ENCOUNTER — Telehealth: Payer: Self-pay | Admitting: *Deleted

## 2022-07-19 NOTE — Telephone Encounter (Signed)
  Follow up Call-     07/16/2022    7:12 AM  Call back number  Post procedure Call Back phone  # [phone]  Permission to leave phone message Yes     Patient questions:  Do you have a fever, pain , or abdominal swelling? No. Pain Score  0 *  Have you tolerated food without any problems? Yes.    Have you been able to return to your normal activities? Yes.    Do you have any questions about your discharge instructions: Diet   No. Medications  No. Follow up visit  No.  Do you have questions or concerns about your Care? No.  Actions: * If pain score is 4 or above: No action needed, pain <4.

## 2022-09-14 ENCOUNTER — Other Ambulatory Visit: Payer: Medicare Other

## 2022-11-30 ENCOUNTER — Ambulatory Visit: Payer: Medicare Other

## 2022-12-01 ENCOUNTER — Telehealth: Payer: Self-pay | Admitting: Internal Medicine

## 2022-12-01 NOTE — Telephone Encounter (Signed)
Do you know of any reason Pt can not do jury duty?

## 2022-12-01 NOTE — Telephone Encounter (Signed)
Pt requesting letter to be faxed to [phone] Attn: Kris Mouton, [phone]   Please call pt to advise so she can go pick up faxed letter.

## 2022-12-01 NOTE — Telephone Encounter (Signed)
Pt was summoned to jury duty 3/11 and would like a dr's note excusing her from this. She stated she is not able to get here so she could pick it up at a closer [facility] facility or if it able to be faxed to a [employer] store. Please advise.

## 2022-12-02 NOTE — Telephone Encounter (Signed)
Received fax confirmation

## 2022-12-02 NOTE — Telephone Encounter (Signed)
Ok to write a note, in my opinion sitting in the same position for multiple hours would be very taxing to the patient

## 2022-12-02 NOTE — Telephone Encounter (Signed)
Patient called to get an update on jury duty letter, stating it needs to be turned in ASAP because her court date is on 12/06/22. Pt advised that we are waiting for provider to respond.

## 2022-12-02 NOTE — Telephone Encounter (Signed)
Reason for excuse?

## 2022-12-02 NOTE — Telephone Encounter (Signed)
Spoke w/ Pt- juror number [id number]. Letter faxed to [phone] as requested.

## 2022-12-02 NOTE — Telephone Encounter (Signed)
Pt states she has hardware in her neck and back, chronic pain?

## 2023-01-11 ENCOUNTER — Encounter: Payer: Self-pay | Admitting: Internal Medicine

## 2023-01-13 ENCOUNTER — Telehealth: Payer: Self-pay | Admitting: Internal Medicine

## 2023-01-13 NOTE — Telephone Encounter (Signed)
Contacted Courtney Larsen to schedule their annual wellness visit. Appointment made for 01/27/2023.  Courtney Larsen; Care Guide Ambulatory Clinical Support [facility] Direct Dial: [phone]   

## 2023-01-13 NOTE — Telephone Encounter (Signed)
Contacted Courtney Larsen to schedule their annual wellness visit. Appointment made for 01/27/2023.  Courtney Larsen; Care Guide Ambulatory Clinical Support [place] [facility] Direct Dial: [phone]

## 2023-01-24 ENCOUNTER — Ambulatory Visit (INDEPENDENT_AMBULATORY_CARE_PROVIDER_SITE_OTHER): Payer: Medicare Other | Admitting: Internal Medicine

## 2023-01-24 ENCOUNTER — Encounter: Payer: Self-pay | Admitting: Internal Medicine

## 2023-01-24 VITALS — BP 120/80 | HR 65 | Temp 98.3°F | Resp 16 | Ht 68.0 in | Wt 177.1 lb

## 2023-01-24 DIAGNOSIS — E785 Hyperlipidemia, unspecified: Secondary | ICD-10-CM

## 2023-01-24 DIAGNOSIS — N6320 Unspecified lump in the left breast, unspecified quadrant: Secondary | ICD-10-CM

## 2023-01-24 DIAGNOSIS — Z0000 Encounter for general adult medical examination without abnormal findings: Secondary | ICD-10-CM | POA: Diagnosis not present

## 2023-01-24 DIAGNOSIS — E559 Vitamin D deficiency, unspecified: Secondary | ICD-10-CM | POA: Diagnosis not present

## 2023-01-24 LAB — COMPREHENSIVE METABOLIC PANEL
ALT: 10 U/L (ref 0–35)
AST: 15 U/L (ref 0–37)
Albumin: 4.2 g/dL (ref 3.5–5.2)
Alkaline Phosphatase: 67 U/L (ref 39–117)
BUN: 16 mg/dL (ref 6–23)
CO2: 30 mEq/L (ref 19–32)
Calcium: 9 mg/dL (ref 8.4–10.5)
Chloride: 104 mEq/L (ref 96–112)
Creatinine, Ser: 0.77 mg/dL (ref 0.40–1.20)
GFR: 82.03 mL/min (ref >60.00)
Glucose, Bld: 83 mg/dL (ref 70–99)
Potassium: 4.3 mEq/L (ref 3.5–5.1)
Sodium: 141 mEq/L (ref 135–145)
Total Bilirubin: 0.5 mg/dL (ref 0.2–1.2)
Total Protein: 7.2 g/dL (ref 6.0–8.3)

## 2023-01-24 LAB — CBC WITH DIFFERENTIAL/PLATELET
Basophils Absolute: 0 10*3/uL (ref 0.0–0.1)
Basophils Relative: 0.7 % (ref 0.0–3.0)
Eosinophils Absolute: 0.2 10*3/uL (ref 0.0–0.7)
Eosinophils Relative: 4.4 % (ref 0.0–5.0)
HCT: 38.5 % (ref 36.0–46.0)
Hemoglobin: 12.4 g/dL (ref 12.0–15.0)
Lymphocytes Relative: 35.5 % (ref 12.0–46.0)
Lymphs Abs: 1.6 10*3/uL (ref 0.7–4.0)
MCHC: 32.3 g/dL (ref 30.0–36.0)
MCV: 89.3 fl (ref 78.0–100.0)
Monocytes Absolute: 0.3 10*3/uL (ref 0.1–1.0)
Monocytes Relative: 7.2 % (ref 3.0–12.0)
Neutro Abs: 2.3 10*3/uL (ref 1.4–7.7)
Neutrophils Relative %: 52.2 % (ref 43.0–77.0)
Platelets: 229 10*3/uL (ref 150.0–400.0)
RBC: 4.31 Mil/uL (ref 3.87–5.11)
RDW: 14.8 % (ref 11.5–15.5)
WBC: 4.4 10*3/uL (ref 4.0–10.5)

## 2023-01-24 LAB — LIPID PANEL
Cholesterol: 224 mg/dL — ABNORMAL HIGH (ref 0–200)
HDL: 62 mg/dL (ref >39.00)
LDL Cholesterol: 150 mg/dL — ABNORMAL HIGH (ref 0–99)
NonHDL: 161.57
Total CHOL/HDL Ratio: 4
Triglycerides: 58 mg/dL (ref 0.0–149.0)
VLDL: 11.6 mg/dL (ref 0.0–40.0)

## 2023-01-24 LAB — VITAMIN D 25 HYDROXY (VIT D DEFICIENCY, FRACTURES): VITD: 12.67 ng/mL — ABNORMAL LOW (ref 30.00–100.00)

## 2023-01-24 NOTE — Progress Notes (Unsigned)
Subjective:    Patient ID: Courtney Larsen, female    DOB: [birth year], 62 y.o.   MRN: [id number]  DOS:  01/24/2023 Type of visit - description: Here for CPX  In general feeling well. She is exercising a little less than before. During the pollen season she admits to on and off cough throughout the day with occasional yellow sputum. No DOE, no wheezing.   Review of Systems  Other than above, a 14 point review of systems is negative    Past Medical History:  Diagnosis Date   Allergy    SEASONAL   Anemia    Asthma    allery induced - inhaler rarely used   Cyst of breast 06/26/2012   left - resolved on f/u per pt   Diverticulosis    Family history of bleeding or clotting disorder    ?Von Willebrand Disease: Patient, sister and mother    Helicobacter pylori gastritis 02/05/2014   EGD 01/2014   Hyperlipidemia    Personal history of colonic polyp - sessile serrated polyp 11/20/2013    Past Surgical History:  Procedure Laterality Date   ANTERIOR CERVICAL DECOMP/DISCECTOMY FUSION  07/06/2012   Procedure: ANTERIOR CERVICAL DECOMPRESSION/DISCECTOMY FUSION 1 LEVEL;  Surgeon: Carmela Hurt, MD;  Location: [facility];  Service: Neurosurgery;  Laterality: N/A;  Cervical six-seven anterior cervical decompression with fusion plating and bonegraft   BACK SURGERY  2011   LOW BACK   CESAREAN SECTION  1988   COLONOSCOPY  01/27/2017   with snare polypectomy   WISDOM TOOTH EXTRACTION     Social History   Socioeconomic History   Marital status: Single    Spouse name: Not on file   Number of children: 3   Years of education: 14   Highest education level: Not on file  Occupational History   Occupation: Retired, Public relations account executive   Tobacco Use   Smoking status: Never   Smokeless tobacco: Never  Vaping Use   Vaping Use: Never used  Substance and Sexual Activity   Alcohol use: No    Alcohol/week: 0.0 standard drinks of alcohol   Drug use: No   Sexual activity: Yes     Partners: Male    Birth control/protection: Post-menopausal    Comment: 1st intercourse- 14, partners- 4, current partner- 15 yrs  Other Topics Concern   Not on file  Social History Narrative   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    Move from [place] to [facility] to [place] in 2013   Lives with fiancee   Children: 3 ([place], [place], [place])       Social Determinants of Health   Financial Resource Strain: Low Risk  (11/26/2021)   Overall Financial Resource Strain (CARDIA)    Difficulty of Paying Living Expenses: Not hard at all  Food Insecurity: No Food Insecurity (11/26/2021)   Hunger Vital Sign    Worried About Running Out of Food in the Last Year: Never true    Ran Out of Food in the Last Year: Never true  Transportation Needs: No Transportation Needs (11/26/2021)   PRAPARE - Administrator, Civil Service (Medical): No    Lack of Transportation (Non-Medical): No  Physical Activity: Inactive (11/26/2021)   Exercise Vital Sign    Days of Exercise per Week: 0 days    Minutes of Exercise per Session: 0 min  Stress: No Stress Concern Present (11/26/2021)   [employer] of Occupational Health - Occupational Stress Questionnaire  Feeling of Stress : Not at all  Social Connections: Socially Isolated (11/26/2021)   Social Connection and Isolation Panel [NHANES]    Frequency of Communication with Friends and Family: Once a week    Frequency of Social Gatherings with Friends and Family: Once a week    Attends Religious Services: Never    Database administrator or Organizations: No    Attends Banker Meetings: Never    Marital Status: Never married  Intimate Partner Violence: Not At Risk (11/26/2021)   Humiliation, Afraid, Rape, and Kick questionnaire    Fear of Current or Ex-Partner: No    Emotionally Abused: No    Physically Abused: No    Sexually Abused: No    Current Outpatient Medications  Medication Instructions   cholecalciferol (VITAMIN D3) 2,000 Units, Oral,  Daily       Objective:   Physical Exam BP 120/80   Pulse 65   Temp 98.3 F (36.8 C) (Oral)   Resp 16   Ht 5\' 8"  (1.727 m)   Wt 177 lb 2 oz (80.3 kg)   LMP 06/26/2010   SpO2 96%   BMI 26.93 kg/m  General: Well developed, NAD, BMI noted Neck: No  thyromegaly  HEENT:  Normocephalic . Face symmetric, atraumatic Lungs:  CTA B Normal respiratory effort, no intercostal retractions, no accessory muscle use. Heart: RRR,  no murmur.  Abdomen:  Not distended, soft, non-tender. No rebound or rigidity.   Lower extremities: no pretibial edema bilaterally  Skin: Exposed areas without rash. Not pale. Not jaundice Neurologic:  alert & oriented X3.  Speech normal, gait appropriate for age and unassisted Strength symmetric and appropriate for age.  Psych: Cognition and judgment appear intact.  Cooperative with normal attention span and concentration.  Behavior appropriate. No anxious or depressed appearing.     Assessment    ASSESSMENT new patient to me 11-2018, transferring due to location Asthma PFTs 02/2014 SPIROMETRY: Severe Airflow Limitation. Significant improvement in airflows after bronchodilator Allergic rhinitis H/o asbestos exposure.  Normal CXR 2020 Menopause  EGD 02/01/2014: + H. pylori, treated. Tinnitus  Saw Dr Danice Goltz 03/2020  PLAN Here for CPX Asthma: During pollen season symptoms are mildly active, inhalers?  Declines.  Rec  Allegra as needed. Vitamin D deficiency: Not on supplements, recommend vitamin D3 2000 units daily.  Checking labs Dyslipidemia: Last LDL increased, cardiovascular risk 6.8% at 8 years  Encouraged increased exercise, reports she has a relatively healthy diet.  Checking labs. RTC 1 year CPX

## 2023-01-24 NOTE — Patient Instructions (Addendum)
Vaccines I recommend:  Covid booster if not done by 05-2022 Tdap (tetanus)  Take over-the-counter vitamin D3: 2000 units daily.  Trying to increase your physical activity, exercise 3 hours a week.  Take allergy medications when the pollen is high. You could try Allegra 60 mg 1 tablet twice daily as needed.  We are scheduling you for breast ultrasound.  GO TO THE LAB : Get the blood work     GO TO THE FRONT DESK, PLEASE SCHEDULE YOUR APPOINTMENTS Come back for physical exam in 1 year    "Health Care Power of attorney" ,  "Living will" (Advance care planning documents)  If you already have a living will or healthcare power of attorney, is recommended you bring the copy to be scanned in your chart.   The document will be available to all the doctors you see in the system.  Advance care planning is a process that supports adults in  understanding and sharing their preferences regarding future medical care.  The patient's preferences are recorded in documents called Advance Directives and the can be modified at any time while the patient is in full mental capacity.   If you don't have one, please consider create one.      More information at: [email or web address]

## 2023-01-25 ENCOUNTER — Encounter: Payer: Self-pay | Admitting: Internal Medicine

## 2023-01-25 NOTE — Assessment & Plan Note (Signed)
-   Td 2013.    - PNM 23: 2013. - shingrix x 2 per pt -RSV 2023 - Vaccines I recommended: Tdap , COVID booster if not done by 05-2022.  Flu shot every fall CCS: Had a colonoscopy 2015 , 01/27/2017, and 07/16/2022, next per GI Female care: saw Gyn 02/2022;  PAP 04/2019 ([facility]) Normal DEXA (2020 @ gyn) [facility] : 02/2022, was recommended follow-up 08/2022, order entered -Lifestyle: counseled  - Labs: CMP FLP CBC vitamin D -ACP discussed

## 2023-01-25 NOTE — Assessment & Plan Note (Signed)
Here for CPX Asthma: During pollen season symptoms are mildly active, inhalers?  Declines.  Rec  Allegra as needed. Vitamin D deficiency: Not on supplements, recommend vitamin D3 2000 units daily.  Checking labs Dyslipidemia: Last LDL increased, cardiovascular risk 6.8% at 8 years  Encouraged increased exercise, reports she has a relatively healthy diet.  Checking labs. RTC 1 year CPX

## 2023-01-26 MED ORDER — VITAMIN D (ERGOCALCIFEROL) 1.25 MG (50000 UNIT) PO CAPS: 50000.0000 [IU] | ORAL_CAPSULE | ORAL | 0 refills | Status: DC

## 2023-01-26 NOTE — Addendum Note (Signed)
Addended byConrad [place] D on: 01/26/2023 04:14 PM   Modules accepted: Orders

## 2023-01-27 ENCOUNTER — Ambulatory Visit (INDEPENDENT_AMBULATORY_CARE_PROVIDER_SITE_OTHER): Payer: Medicare Other | Admitting: *Deleted

## 2023-01-27 DIAGNOSIS — Z0000 Encounter for general adult medical examination without abnormal findings: Secondary | ICD-10-CM | POA: Diagnosis not present

## 2023-01-27 NOTE — Patient Instructions (Signed)
Courtney Larsen , Thank you for taking time to come for your Medicare Wellness Visit. I appreciate your ongoing commitment to your health goals. Please review the following plan we discussed and let me know if I can assist you in the future.     This is a list of the screening recommended for you and due dates:  Health Maintenance  Topic Date Due   Zoster (Shingles) Vaccine (1 of 2) Never done   COVID-19 Vaccine (5 - 2023-24 season) 05/28/2022   DTaP/Tdap/Td vaccine (2 - Td or Tdap) 06/26/2022   Mammogram  02/27/2023   Flu Shot  04/28/2023   Medicare Annual Wellness Visit  01/27/2024   Pap Smear  05/09/2024   Colon Cancer Screening  07/16/2029   Hepatitis C Screening: USPSTF Recommendation to screen - Ages 18-79 yo.  Completed   HIV Screening  Completed   HPV Vaccine  Aged Out     Next appointment: Follow up in one year for your annual wellness visit.   Preventive Care 40-64 Years, Female Preventive care refers to lifestyle choices and visits with your health care provider that can promote health and wellness. What does preventive care include? A yearly physical exam. This is also called an annual well check. Dental exams once or twice a year. Routine eye exams. Ask your health care provider how often you should have your eyes checked. Personal lifestyle choices, including: Daily care of your teeth and gums. Regular physical activity. Eating a healthy diet. Avoiding tobacco and drug use. Limiting alcohol use. Practicing safe sex. Taking low-dose aspirin daily starting at age 4. Taking vitamin and mineral supplements as recommended by your health care provider. What happens during an annual well check? The services and screenings done by your health care provider during your annual well check will depend on your age, overall health, lifestyle risk factors, and family history of disease. Counseling  Your health care provider may ask you questions about your: Alcohol use. Tobacco  use. Drug use. Emotional well-being. Home and relationship well-being. Sexual activity. Eating habits. Work and work Astronomer. Method of birth control. Menstrual cycle. Pregnancy history. Screening  You may have the following tests or measurements: Height, weight, and BMI. Blood pressure. Lipid and cholesterol levels. These may be checked every 5 years, or more frequently if you are over 21 years old. Skin check. Lung cancer screening. You may have this screening every year starting at age 68 if you have a 30-pack-year history of smoking and currently smoke or have quit within the past 15 years. Fecal occult blood test (FOBT) of the stool. You may have this test every year starting at age 39. Flexible sigmoidoscopy or colonoscopy. You may have a sigmoidoscopy every 5 years or a colonoscopy every 10 years starting at age 29. Hepatitis C blood test. Hepatitis B blood test. Sexually transmitted disease (STD) testing. Diabetes screening. This is done by checking your blood sugar (glucose) after you have not eaten for a while (fasting). You may have this done every 1-3 years. Mammogram. This may be done every 1-2 years. Talk to your health care provider about when you should start having regular mammograms. This may depend on whether you have a family history of breast cancer. BRCA-related cancer screening. This may be done if you have a family history of breast, ovarian, tubal, or peritoneal cancers. Pelvic exam and Pap test. This may be done every 3 years starting at age 49. Starting at age 41, this may be done every 5  years if you have a Pap test in combination with an HPV test. Bone density scan. This is done to screen for osteoporosis. You may have this scan if you are at high risk for osteoporosis. Discuss your test results, treatment options, and if necessary, the need for more tests with your health care provider. Vaccines  Your health care provider may recommend certain vaccines,  such as: Influenza vaccine. This is recommended every year. Tetanus, diphtheria, and acellular pertussis (Tdap, Td) vaccine. You may need a Td booster every 10 years. Zoster vaccine. You may need this after age 66. Pneumococcal 13-valent conjugate (PCV13) vaccine. You may need this if you have certain conditions and were not previously vaccinated. Pneumococcal polysaccharide (PPSV23) vaccine. You may need one or two doses if you smoke cigarettes or if you have certain conditions. Talk to your health care provider about which screenings and vaccines you need and how often you need them. This information is not intended to replace advice given to you by your health care provider. Make sure you discuss any questions you have with your health care provider. Document Released: 10/10/2015 Document Revised: 06/02/2016 Document Reviewed: 07/15/2015 Elsevier Interactive Patient Education  2017 [employer].    Fall Prevention in the Home Falls can cause injuries. They can happen to people of all ages. There are many things you can do to make your home safe and to help prevent falls. What can I do on the outside of my home? Regularly fix the edges of walkways and driveways and fix any cracks. Remove anything that might make you trip as you walk through a door, such as a raised step or threshold. Trim any bushes or trees on the path to your home. Use bright outdoor lighting. Clear any walking paths of anything that might make someone trip, such as rocks or tools. Regularly check to see if handrails are loose or broken. Make sure that both sides of any steps have handrails. Any raised decks and porches should have guardrails on the edges. Have any leaves, snow, or ice cleared regularly. Use sand or salt on walking paths during winter. Clean up any spills in your garage right away. This includes oil or grease spills. What can I do in the bathroom? Use night lights. Install grab bars by the toilet and  in the tub and shower. Do not use towel bars as grab bars. Use non-skid mats or decals in the tub or shower. If you need to sit down in the shower, use a plastic, non-slip stool. Keep the floor dry. Clean up any water that spills on the floor as soon as it happens. Remove soap buildup in the tub or shower regularly. Attach bath mats securely with double-sided non-slip rug tape. Do not have throw rugs and other things on the floor that can make you trip. What can I do in the bedroom? Use night lights. Make sure that you have a light by your bed that is easy to reach. Do not use any sheets or blankets that are too big for your bed. They should not hang down onto the floor. Have a firm chair that has side arms. You can use this for support while you get dressed. Do not have throw rugs and other things on the floor that can make you trip. What can I do in the kitchen? Clean up any spills right away. Avoid walking on wet floors. Keep items that you use a lot in easy-to-reach places. If you need to reach  something above you, use a strong step stool that has a grab bar. Keep electrical cords out of the way. Do not use floor polish or wax that makes floors slippery. If you must use wax, use non-skid floor wax. Do not have throw rugs and other things on the floor that can make you trip. What can I do with my stairs? Do not leave any items on the stairs. Make sure that there are handrails on both sides of the stairs and use them. Fix handrails that are broken or loose. Make sure that handrails are as long as the stairways. Check any carpeting to make sure that it is firmly attached to the stairs. Fix any carpet that is loose or worn. Avoid having throw rugs at the top or bottom of the stairs. If you do have throw rugs, attach them to the floor with carpet tape. Make sure that you have a light switch at the top of the stairs and the bottom of the stairs. If you do not have them, ask someone to add  them for you. What else can I do to help prevent falls? Wear shoes that: Do not have high heels. Have rubber bottoms. Are comfortable and fit you well. Are closed at the toe. Do not wear sandals. If you use a stepladder: Make sure that it is fully opened. Do not climb a closed stepladder. Make sure that both sides of the stepladder are locked into place. Ask someone to hold it for you, if possible. Clearly mark and make sure that you can see: Any grab bars or handrails. First and last steps. Where the edge of each step is. Use tools that help you move around (mobility aids) if they are needed. These include: Canes. Walkers. Scooters. Crutches. Turn on the lights when you go into a dark area. Replace any light bulbs as soon as they burn out. Set up your furniture so you have a clear path. Avoid moving your furniture around. If any of your floors are uneven, fix them. If there are any pets around you, be aware of where they are. Review your medicines with your doctor. Some medicines can make you feel dizzy. This can increase your chance of falling. Ask your doctor what other things that you can do to help prevent falls. This information is not intended to replace advice given to you by your health care provider. Make sure you discuss any questions you have with your health care provider. Document Released: 07/10/2009 Document Revised: 02/19/2016 Document Reviewed: 10/18/2014 Elsevier Interactive Patient Education  2017 [employer].

## 2023-01-27 NOTE — Progress Notes (Signed)
Subjective:  Pt completed Fall risk and Depression screen in office with PCP on 01/24/23.  Answers verified with pt.    Courtney Larsen is a 62 y.o. female who presents for Medicare Annual (Subsequent) preventive examination.  I connected with  Courtney Larsen on 01/27/23 by a audio enabled telemedicine application and verified that I am speaking with the correct person using two identifiers.  Patient Location: Home  Provider Location: Office/Clinic  I discussed the limitations of evaluation and management by telemedicine. The patient expressed understanding and agreed to proceed.   Review of Systems     Cardiac Risk Factors include: none     Objective:    There were no vitals filed for this visit. There is no height or weight on file to calculate BMI.     01/27/2023    9:44 AM 11/26/2021    7:42 AM 09/25/2019    3:02 PM 01/13/2017   10:56 AM  Advanced Directives  Does Patient Have a Medical Advance Directive? Yes No Yes No  Type of Estate agent of [place];Living will  Healthcare Power of [place];Living will   Does patient want to make changes to medical advance directive? No - Patient declined  No - Patient declined   Copy of Healthcare Power of Attorney in Chart? No - copy requested  Yes - validated most recent copy scanned in chart (See row information)   Would patient like information on creating a medical advance directive?  Yes (MAU/Ambulatory/Procedural Areas - Information given)      Current Medications (verified) Outpatient Encounter Medications as of 01/27/2023  Medication Sig   cholecalciferol (VITAMIN D3) 25 MCG (1000 UNIT) tablet Take 2,000 Units by mouth daily.   Vitamin D, Ergocalciferol, (DRISDOL) 1.25 MG (50000 UNIT) CAPS capsule Take 1 capsule (50,000 Units total) by mouth every 7 (seven) days.   No facility-administered encounter medications on file as of 01/27/2023.    Allergies (verified) Patient has no known allergies.    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   Cyst of breast 06/26/2012   left - resolved on f/u per pt   Diverticulosis    Family history of bleeding or clotting disorder    ?Von Willebrand Disease: Patient, sister and mother    Helicobacter pylori gastritis 02/05/2014   EGD 01/2014   Hyperlipidemia    Personal history of colonic polyp - sessile serrated polyp 11/20/2013   Past Surgical History:  Procedure Laterality Date   ANTERIOR CERVICAL DECOMP/DISCECTOMY FUSION  07/06/2012   Procedure: ANTERIOR CERVICAL DECOMPRESSION/DISCECTOMY FUSION 1 LEVEL;  Surgeon: Carmela Hurt, MD;  Location: [facility];  Service: Neurosurgery;  Laterality: N/A;  Cervical six-seven anterior cervical decompression with fusion plating and bonegraft   BACK SURGERY  2011   LOW BACK   CESAREAN SECTION  1988   COLONOSCOPY  01/27/2017   with snare polypectomy   WISDOM TOOTH EXTRACTION     Family History  Adopted: Yes  Problem Relation Age of Onset   Diabetes Mother    Ulcers Mother    Bleeding Disorder Mother    Early death Mother    Learning disabilities Mother    Heart attack Mother        late in life   Cancer Sister 44       BRAIN TUMOR   Breast cancer Sister 50  Heart disease Sister    Bleeding Disorder Sister    Colon cancer Neg Hx    Esophageal cancer Neg Hx    Pancreatic cancer Neg Hx    Rectal cancer Neg Hx    Stomach cancer Neg Hx    Social History   Socioeconomic History   Marital status: Single    Spouse name: Not on file   Number of children: 3   Years of education: 14   Highest education level: Not on file  Occupational History   Occupation: Retired, Public relations account executive   Tobacco Use   Smoking status: Never   Smokeless tobacco: Never  Vaping Use   Vaping Use: Never used  Substance and Sexual Activity   Alcohol use: No    Alcohol/week: 0.0 standard drinks of alcohol   Drug  use: No   Sexual activity: Yes    Partners: Male    Birth control/protection: Post-menopausal    Comment: 1st intercourse- 14, partners- 4, current partner- 15 yrs  Other Topics Concern   Not on file  Social History Narrative   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    Move from [place] to [facility] to [place] in 2013   Lives with fiancee   Children: 3 ([place], [place], [place])       Social Determinants of Health   Financial Resource Strain: Low Risk  (11/26/2021)   Overall Financial Resource Strain (CARDIA)    Difficulty of Paying Living Expenses: Not hard at all  Food Insecurity: No Food Insecurity (01/27/2023)   Hunger Vital Sign    Worried About Running Out of Food in the Last Year: Never true    Ran Out of Food in the Last Year: Never true  Transportation Needs: No Transportation Needs (01/27/2023)   PRAPARE - Administrator, Civil Service (Medical): No    Lack of Transportation (Non-Medical): No  Physical Activity: Inactive (11/26/2021)   Exercise Vital Sign    Days of Exercise per Week: 0 days    Minutes of Exercise per Session: 0 min  Stress: No Stress Concern Present (11/26/2021)   [employer] of Occupational Health - Occupational Stress Questionnaire    Feeling of Stress : Not at all  Social Connections: Socially Isolated (11/26/2021)   Social Connection and Isolation Panel [NHANES]    Frequency of Communication with Friends and Family: Once a week    Frequency of Social Gatherings with Friends and Family: Once a week    Attends Religious Services: Never    Database administrator or Organizations: No    Attends Engineer, structural: Never    Marital Status: Never married    Tobacco Counseling Counseling given: Not Answered   Clinical Intake:  Pre-visit preparation completed: Yes  Pain : No/denies pain  BMI - recorded: 26.93 Nutritional Status: BMI 25 -29 Overweight Nutritional Risks: None Diabetes: No  How often do you need to have someone help you  when you read instructions, pamphlets, or other written materials from your doctor or pharmacy?: 1 - Never   Activities of Daily Living    01/27/2023    9:47 AM  In your present state of health, do you have any difficulty performing the following activities:  Hearing? 0  Comment slight ringing in both ears  Vision? 0  Difficulty concentrating or making decisions? 0  Walking or climbing stairs? 0  Dressing or bathing? 0  Doing errands, shopping? 0  Preparing Food and eating ? N  Using  the Toilet? N  In the past six months, have you accidently leaked urine? N  Do you have problems with loss of bowel control? N  Managing your Medications? N  Managing your Finances? N  Housekeeping or managing your Housekeeping? N    Patient Care Team: Wanda Plump, MD as PCP - General (Internal Medicine) Coletta Memos, MD (Neurosurgery)  Indicate any recent Medical Services you may have received from other than [facility] providers in the past year (date may be approximate).     Assessment:   This is a routine wellness examination for Courtney Larsen.  Hearing/Vision screen No results found.  Dietary issues and exercise activities discussed: Current Exercise Habits: The patient does not participate in regular exercise at present, Exercise limited by: None identified   Goals Addressed   None    Depression Screen    01/24/2023    8:58 AM 01/21/2022   10:55 AM 11/26/2021    7:48 AM 01/19/2021   10:51 AM 09/25/2019    3:06 PM 12/18/2018    9:58 AM 04/18/2017   10:17 AM  PHQ 2/9 Scores  PHQ - 2 Score 0 0 0 0 0 0 0  PHQ- 9 Score      3     Fall Risk    01/24/2023    8:58 AM 01/21/2022   10:55 AM 11/26/2021    7:47 AM 01/19/2021   10:51 AM 09/25/2019    3:06 PM  Fall Risk   Falls in the past year? 0 0 0  0  Number falls in past yr: 0 0 0 0   Injury with Fall? 0 0 0 0   Follow up Falls evaluation completed Falls evaluation completed Falls prevention discussed  Education provided;Falls prevention  discussed    FALL RISK PREVENTION PERTAINING TO THE HOME:  Any stairs in or around the home? Yes  If so, are there any without handrails? No  Home free of loose throw rugs in walkways, pet beds, electrical cords, etc? Yes  Adequate lighting in your home to reduce risk of falls? Yes   ASSISTIVE DEVICES UTILIZED TO PREVENT FALLS:  Life alert? No  Use of a cane, walker or w/c? No  Grab bars in the bathroom? Yes  Shower chair or bench in shower? No  Elevated toilet seat or a handicapped toilet? No   TIMED UP AND GO:  Was the test performed?  No, audio visit .   Cognitive Function:        01/27/2023    9:53 AM  6CIT Screen  What Year? 0 points  What month? 0 points  What time? 0 points  Count back from 20 0 points  Months in reverse 0 points  Repeat phrase 2 points  Total Score 2 points    Immunizations Immunization History  Administered Date(s) Administered   Influenza,inj,Quad PF,6+ Mos 06/27/2017, 07/05/2019   Moderna Covid-19 Vaccine Bivalent Booster 68yrs & up 07/24/2021   Moderna SARS-COV2 Booster Vaccination 03/03/2021   Moderna Sars-Covid-2 Vaccination 10/26/2019, 11/23/2019, 09/17/2020   Pneumococcal Polysaccharide-23 07/07/2012   RSV,unspecified 08/07/2022   Tdap 06/26/2012    TDAP status: Due, Education has been provided regarding the importance of this vaccine. Advised may receive this vaccine at local pharmacy or Health Dept. Aware to provide a copy of the vaccination record if obtained from local pharmacy or Health Dept. Verbalized acceptance and understanding.  Flu Vaccine status: Up to date  Pneumococcal vaccine status: Up to date  Covid-19 vaccine status: Information provided  on how to obtain vaccines.   Qualifies for Shingles Vaccine? Yes   Zostavax completed No   Shingrix Completed?: No.    Education has been provided regarding the importance of this vaccine. Patient has been advised to call insurance company to determine out of pocket expense  if they have not yet received this vaccine. Advised may also receive vaccine at local pharmacy or Health Dept. Verbalized acceptance and understanding.  Screening Tests Health Maintenance  Topic Date Due   Zoster Vaccines- Shingrix (1 of 2) Never done   COVID-19 Vaccine (5 - 2023-24 season) 05/28/2022   DTaP/Tdap/Td (2 - Td or Tdap) 06/26/2022   Medicare Annual Wellness (AWV)  11/27/2022   MAMMOGRAM  02/27/2023   INFLUENZA VACCINE  04/28/2023   PAP SMEAR-Modifier  05/09/2024   COLONOSCOPY (Pts 45-11yrs Insurance coverage will need to be confirmed)  07/16/2029   Hepatitis C Screening  Completed   HIV Screening  Completed   HPV VACCINES  Aged Out    Health Maintenance  Health Maintenance Due  Topic Date Due   Zoster Vaccines- Shingrix (1 of 2) Never done   COVID-19 Vaccine (5 - 2023-24 season) 05/28/2022   DTaP/Tdap/Td (2 - Td or Tdap) 06/26/2022   Medicare Annual Wellness (AWV)  11/27/2022    Colorectal cancer screening: Type of screening: Colonoscopy. Completed 07/16/22. Repeat every 7 years  Mammogram status: Completed 02/26/22. Repeat every year  Lung Cancer Screening: (Low Dose CT Chest recommended if Age 62-80 years, 30 pack-year currently smoking OR have quit w/in 15years.) does not qualify.   Additional Screening:  Hepatitis C Screening: does qualify; Completed 04/18/17  Vision Screening: Recommended annual ophthalmology exams for early detection of glaucoma and other disorders of the eye. Is the patient up to date with their annual eye exam?  No  Who is the provider or what is the name of the office in which the patient attends annual eye exams? Not established with eye doctor If pt is not established with a provider, would they like to be referred to a provider to establish care? No .   Dental Screening: Recommended annual dental exams for proper oral hygiene  Community Resource Referral / Chronic Care Management: CRR required this visit?  No   CCM required this  visit?  No      Plan:     I have personally reviewed and noted the following in the patient's chart:   Medical and social history Use of alcohol, tobacco or illicit drugs  Current medications and supplements including opioid prescriptions. Patient is not currently taking opioid prescriptions. Functional ability and status Nutritional status Physical activity Advanced directives List of other physicians Hospitalizations, surgeries, and ER visits in previous 12 months Vitals Screenings to include cognitive, depression, and falls Referrals and appointments  In addition, I have reviewed and discussed with patient certain preventive protocols, quality metrics, and best practice recommendations. A written personalized care plan for preventive services as well as general preventive health recommendations were provided to patient.   Due to this being a telephonic visit, the after visit summary with patients personalized plan was offered to patient via mail or my-chart. Patient would like to access on my-chart.  Donne Anon, [place]   01/27/2023   Nurse Notes: None

## 2023-01-31 ENCOUNTER — Other Ambulatory Visit: Payer: Self-pay | Admitting: Internal Medicine

## 2023-01-31 DIAGNOSIS — N6320 Unspecified lump in the left breast, unspecified quadrant: Secondary | ICD-10-CM

## 2023-03-01 ENCOUNTER — Telehealth: Payer: Self-pay | Admitting: Internal Medicine

## 2023-03-01 DIAGNOSIS — J4520 Mild intermittent asthma, uncomplicated: Secondary | ICD-10-CM

## 2023-03-02 NOTE — Telephone Encounter (Signed)
Pt has called to cancel appointment for 6.7.24. she states she doesn't need to be seen she just wants a referral to be seen by another respiratory therapist. Please advise and call pt back with any information

## 2023-03-02 NOTE — Telephone Encounter (Signed)
LMOM asking for call back, needing clarification on what Pt is requesting.

## 2023-03-04 ENCOUNTER — Encounter: Payer: Medicare Other | Admitting: Internal Medicine

## 2023-03-04 ENCOUNTER — Ambulatory Visit: Payer: Medicare Other | Admitting: Internal Medicine

## 2023-03-04 NOTE — Telephone Encounter (Signed)
No return call, closing note.

## 2023-03-09 NOTE — Telephone Encounter (Signed)
Do you know anything about Pt needing resp therapy? I reviewed last OV note and asthma was mentioned but she declined inhalers.

## 2023-03-09 NOTE — Telephone Encounter (Signed)
LMOM asking for call back.  

## 2023-03-09 NOTE — Addendum Note (Signed)
Addended byConrad [place] D on: 03/09/2023 04:47 PM   Modules accepted: Orders

## 2023-03-09 NOTE — Telephone Encounter (Signed)
Pt returned call about referral for respiratory therapist in [place] because she hasn't heard anything. Advised that CMA tried to call her last week to clarify. Please follow up with patient to discuss. Pt lives in [place] and wants referral to be at a location in [place], close to [address] if possible.

## 2023-03-09 NOTE — Telephone Encounter (Signed)
I do not see why she would need a respiratory therapist. Please asked the patient why she needs to see a respiratory therapist and let me know. Perhaps she likes to see an allergist for asthma?

## 2023-03-09 NOTE — Telephone Encounter (Signed)
Spoke w/ Pt- she is wanting to see a respiratory therapist for "vomiting mucus"- informed she needed to see PCP, she states that she discussed this w/ him at her last OV. I informed her that unfortunately respiratory therapists are employed in the hospital, offered her a allergy/asthma referral. She agreed as long as they are in [place]. Informed that [facility]'s office is on [address] in [place]. Pt verbalized understanding.

## 2023-04-18 ENCOUNTER — Other Ambulatory Visit: Payer: Self-pay | Admitting: Internal Medicine

## 2023-06-22 ENCOUNTER — Encounter: Payer: Self-pay | Admitting: Allergy

## 2023-06-22 ENCOUNTER — Ambulatory Visit: Payer: Medicare Other | Admitting: Allergy

## 2023-06-22 VITALS — BP 108/62 | HR 70 | Temp 98.2°F | Resp 16 | Ht 69.0 in | Wt 173.0 lb

## 2023-06-22 DIAGNOSIS — J310 Chronic rhinitis: Secondary | ICD-10-CM

## 2023-06-22 DIAGNOSIS — J358 Other chronic diseases of tonsils and adenoids: Secondary | ICD-10-CM | POA: Diagnosis not present

## 2023-06-22 DIAGNOSIS — R053 Chronic cough: Secondary | ICD-10-CM

## 2023-06-22 MED ORDER — ALBUTEROL SULFATE HFA 108 (90 BASE) MCG/ACT IN AERS: 2.0000 | INHALATION_SPRAY | RESPIRATORY_TRACT | 1 refills | Status: AC | PRN

## 2023-06-22 MED ORDER — AZELASTINE HCL 0.1 % NA SOLN: 2.0000 | Freq: Two times a day (BID) | NASAL | 5 refills | Status: AC

## 2023-06-22 NOTE — Progress Notes (Signed)
New Patient Note  RE: Courtney Larsen MRN: [id number] DOB: 22-Oct-1960 Date of Office Visit: 06/22/2023   Primary care provider: Wanda Plump, MD  Chief Complaint: cough and mucus  History of present illness: Courtney Larsen is a 62 y.o. female presenting today for evaluation of mild intermittent asthma without complication.  Discussed the use of AI scribe software for clinical note transcription with the patient, who gave verbal consent to proceed.  The patient presents with a chief complaint of persistent coughing and heavy mucus production, which has been ongoing for approximately one to two months. The coughing episodes are described as severe, often leading to a sensation of choking and once felt like she could faint+. The patient also reports a sensation of a lump of mucus in the throat, which triggers the coughing episodes. The coughing is accompanied by a burning or tingling sensation that starts in the waist area and moves upwards.  She has an albuterol inhaler which was prescribed years ago with an illness and she does not feel it has helped with current cough symptoms when she has used.  However she states it is empty at this time.  She has tried taking local honey which may have some effect.   The coughing and mucus production occur throughout the day and night, in various environments, and do not seem to be affected by weather conditions. The patient has not noticed any associated wheezing or chest tightness. There is no reported history of reflux, and the patient denies experiencing other allergy symptoms such as runny or stuffy nose, itchy or watery eyes, or full or itchy ears. However, the patient does report frequent throat clearing and sneezing episodes of six or seven times in a row.  The patient's symptoms have been causing significant distress, leading to isolation during coughing episodes to avoid disturbing family members and causing concern. The symptoms have also been  disruptive to the patient's sleep, with the patient needing to rearrange themselves due to a sensation of heaviness on the left side when lying down.     The patient also reports a history of tonsil stones and would like to know ways to prevent these.  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   Cyst of breast 06/26/2012   left - resolved on f/u per pt   Diverticulosis    Family history of bleeding or clotting disorder    ?Von Willebrand Disease: Patient, sister and mother    Helicobacter pylori gastritis 02/05/2014   EGD 01/2014   Hyperlipidemia    Personal history of colonic polyp - sessile serrated polyp 11/20/2013    Past surgical history: Past Surgical History:  Procedure Laterality Date   ANTERIOR CERVICAL DECOMP/DISCECTOMY FUSION  07/06/2012   Procedure: ANTERIOR CERVICAL DECOMPRESSION/DISCECTOMY FUSION 1 LEVEL;  Surgeon: Carmela Hurt, MD;  Location: [facility];  Service: Neurosurgery;  Laterality: N/A;  Cervical six-seven anterior cervical decompression with fusion plating and bonegraft   BACK SURGERY  2011   LOW BACK   CESAREAN SECTION  1988   COLONOSCOPY  01/27/2017   with snare polypectomy   WISDOM TOOTH EXTRACTION      Family history:  Family History  Adopted: Yes  Problem Relation Age of Onset   Diabetes Mother    Ulcers  Mother    Bleeding Disorder Mother    Early death Mother    Learning disabilities Mother    Heart attack Mother        late in life   Cancer Sister 92       BRAIN TUMOR   Breast cancer Sister 72   Heart disease Sister    Bleeding Disorder Sister    Cystic fibrosis Son    Asthma Son    Food Allergy Son        shellfish   Cystic fibrosis Niece    Colon cancer Neg Hx    Esophageal cancer Neg Hx    Pancreatic cancer Neg Hx    Rectal cancer Neg Hx    Stomach cancer  Neg Hx    Allergic rhinitis Neg Hx    Immunodeficiency Neg Hx    Urticaria Neg Hx     Social history: Lives in a home with carpeting in the bedroom with oil heating and central cooling.  No pets in the home.  Cats outside the home.  There is no concern for water damage, mildew or roaches in the home. Occupational History   Occupation: Retired, Public relations account executive   Tobacco Use   Smoking status: Never    Passive exposure: Never   Smokeless tobacco: Never  Vaping Use   Vaping status: Never Used   Medication List: Current Outpatient Medications  Medication Sig Dispense Refill   azelastine (ASTELIN) 0.1 % nasal spray Place 2 sprays into both nostrils 2 (two) times daily. 30 mL 5   albuterol (VENTOLIN HFA) 108 (90 Base) MCG/ACT inhaler Inhale 2 puffs into the lungs every 4 (four) hours as needed for wheezing or shortness of breath. 18 g 1   No current facility-administered medications for this visit.    Known medication allergies: No Known Allergies   Physical examination: Blood pressure 108/62, pulse 70, temperature 98.2 F (36.8 C), temperature source Temporal, resp. rate 16, height 5\' 9"  (1.753 m), weight 173 lb (78.5 kg), last menstrual period 06/26/2010, SpO2 97%.  General: Alert, interactive, in no acute distress. HEENT: PERRLA, TMs pearly gray, turbinates non-edematous without discharge, post-pharynx non erythematous. Neck: Supple without lymphadenopathy. Lungs: Clear to auscultation without wheezing, rhonchi or rales. {no increased work of breathing. CV: Normal S1, S2 without murmurs. Abdomen: Nondistended, nontender. Skin: Warm and dry, without lesions or rashes. Extremities:  No clubbing, cyanosis or edema. Neuro:   Grossly intact.  Diagnositics/Labs:  Spirometry: FEV1: 1.62L 67%, FVC: 2.44L 79% predicted.  Status post albuterol she had a 7% increase in FEV1 to 1.73 L or 72% which is close to normal but still does show some evidence of obstructive  pattern  Assessment and plan: Cough with mucus Rhinitis - lung function testing did improve is close to normalization age after albuterol administration thus she does respond to use however there is a mild obstructive pattern that may indicate lower airway involvement.  - will first start with treatment upper airway symptoms first as below - will obtain environmental allergy panel to assess for allergens - common causes of cough with mucus is sinus allergy symptoms, reactive airway of lungs and reflux symptoms - recommend starting antihistamine like Xyzal, Allegra or Zyrtec 1 tab daily - recommend using nasal spray Astelin 2 sprays each nostril twice a day for nasal drainage control - have access to albuterol inhaler 2 puffs every 4-6 hours as needed for cough/wheeze/shortness of breath/chest tightness.  May use 15-20 minutes prior to activity.   Monitor  frequency of use.   - will consider acid reflux medication if above are not effective enough  Tonsil stones - tonsil stones may develop from deep tonsillar crypts that accumulate debris, such as food or sloughed mucosa, thereby providing an ideal environment for the growth of bacteria. May see whitish or yellow pieces of semisolid debris on or emanating from their tonsils. These tonsilliths often have a foul taste and odor, and they can cause halitosis.  Treatment includes frequent gargling of a hydrogen peroxide mouthwash.    Follow-up in 2-3 months or sooner if needed  I appreciate the opportunity to take part in Chalisa's care. Please do not hesitate to contact me with questions.  Sincerely,   Margo Aye, MD Allergy/Immunology [facility]

## 2023-06-22 NOTE — Patient Instructions (Addendum)
Cough with mucus Rhinitis - lung function testing did improve is close to normalization age after albuterol administration thus she does respond to use however there is a mild obstructive pattern that may indicate lower airway involvement.  - will first start with treatment upper airway symptoms first as below - will obtain environmental allergy panel to assess for allergens - common causes of cough with mucus is sinus allergy symptoms, reactive airway of lungs and reflux symptoms - recommend starting antihistamine like Xyzal, Allegra or Zyrtec 1 tab daily - recommend using nasal spray Astelin 2 sprays each nostril twice a day for nasal drainage control - have access to albuterol inhaler 2 puffs every 4-6 hours as needed for cough/wheeze/shortness of breath/chest tightness.  May use 15-20 minutes prior to activity.   Monitor frequency of use.   - will consider acid reflux medication if above are not effective enough  Tonsil stones - tonsil stones may develop from deep tonsillar crypts that accumulate debris, such as food or sloughed mucosa, thereby providing an ideal environment for the growth of bacteria. May see whitish or yellow pieces of semisolid debris on or emanating from their tonsils. These tonsilliths often have a foul taste and odor, and they can cause halitosis.  Treatment includes frequent gargling of a hydrogen peroxide mouthwash.    Follow-up in 2-3 months or sooner if needed

## 2023-06-24 LAB — ALLERGENS W/TOTAL IGE AREA 2
Alternaria Alternata IgE: 3.36 kU/L — AB
Aspergillus Fumigatus IgE: 6.74 kU/L — AB
Bermuda Grass IgE: 0.65 kU/L — AB
Cat Dander IgE: 3.43 kU/L — AB
Cedar, Mountain IgE: 0.32 kU/L — AB
Cladosporium Herbarum IgE: 4.35 kU/L — AB
Cockroach, German IgE: 0.3 kU/L — AB
Common Silver Birch IgE: 3.73 kU/L — AB
Cottonwood IgE: 0.22 kU/L — AB
D Farinae IgE: 5.4 kU/L — AB
D Pteronyssinus IgE: 6.25 kU/L — AB
Dog Dander IgE: 2.06 kU/L — AB
Elm, American IgE: 0.61 kU/L — AB
IgE (Immunoglobulin E), Serum: 646 [IU]/mL — ABNORMAL HIGH (ref 6–495)
Johnson Grass IgE: 0.56 kU/L — AB
Maple/Box Elder IgE: 0.24 kU/L — AB
Mouse Urine IgE: <0.1 kU/L
Oak, White IgE: 2.27 kU/L — AB
Pecan, Hickory IgE: 0.31 kU/L — AB
Penicillium Chrysogen IgE: 1.04 kU/L — AB
Pigweed, Rough IgE: 0.22 kU/L — AB
Ragweed, Short IgE: 0.19 kU/L — AB
Sheep Sorrel IgE Qn: 0.12 kU/L — AB
Timothy Grass IgE: 1.8 kU/L — AB
White Mulberry IgE: <0.1 kU/L

## 2023-08-03 ENCOUNTER — Encounter: Payer: Self-pay | Admitting: Internal Medicine

## 2023-08-10 ENCOUNTER — Encounter: Payer: Self-pay | Admitting: Internal Medicine

## 2023-09-09 ENCOUNTER — Ambulatory Visit: Payer: PRIVATE HEALTH INSURANCE | Admitting: Allergy

## 2023-09-26 ENCOUNTER — Ambulatory Visit: Payer: Medicare Other | Admitting: Internal Medicine

## 2023-10-07 ENCOUNTER — Ambulatory Visit: Payer: Medicare Other | Admitting: Internal Medicine

## 2023-10-21 ENCOUNTER — Ambulatory Visit: Payer: Medicare Other | Admitting: Physician Assistant

## 2023-10-21 ENCOUNTER — Encounter: Payer: Self-pay | Admitting: Family Medicine

## 2023-10-21 ENCOUNTER — Other Ambulatory Visit: Payer: Medicare Other

## 2023-10-21 ENCOUNTER — Ambulatory Visit (INDEPENDENT_AMBULATORY_CARE_PROVIDER_SITE_OTHER): Payer: Medicare Other | Admitting: Family Medicine

## 2023-10-21 VITALS — BP 128/74 | HR 84 | Temp 98.0°F | Resp 16 | Ht 69.0 in | Wt 173.0 lb

## 2023-10-21 DIAGNOSIS — J0100 Acute maxillary sinusitis, unspecified: Secondary | ICD-10-CM

## 2023-10-21 DIAGNOSIS — R053 Chronic cough: Secondary | ICD-10-CM | POA: Diagnosis not present

## 2023-10-21 MED ORDER — PREDNISONE 20 MG PO TABS: 40.0000 mg | ORAL_TABLET | Freq: Every day | ORAL | 0 refills | Status: AC

## 2023-10-21 NOTE — Patient Instructions (Signed)
If you do not hear anything about your referral in the next 1-2 weeks, call our office and ask for an update.  Please get your X-ray done in the basement of our [place] office located on: [address] [place], [place] [address]  You do not need an appointment for that location.   Let us know if you need anything.

## 2023-10-21 NOTE — Progress Notes (Signed)
Chief Complaint  Patient presents with   Sore Throat    Sore throat    Arvilla Meres here for URI complaints.  Duration: 1 day  Associated symptoms: rhinorrhea, sore throat, chest tightness, myalgia, and coughing Denies: sinus congestion, sinus pain, itchy watery eyes, ear pain, ear drainage, wheezing, shortness of breath, and fevers Treatment to date: Theraflu Sick contacts: Yes- Fiancee; was given abx  The patient has been coughing up mucus for around the past year.  She was referred to the allergy team over the summer.  Albuterol has not been helpful for the situation.  She has never had any imaging for this.  She is not vomiting.  No associated shortness of breath.  Past Medical History:  Diagnosis Date   Allergy    SEASONAL   Anemia    Asthma    allery induced - inhaler rarely used   Cyst of breast 06/26/2012   left - resolved on f/u per pt   Diverticulosis    Family history of bleeding or clotting disorder    ?Von Willebrand Disease: Patient, sister and mother    Helicobacter pylori gastritis 02/05/2014   EGD 01/2014   Hyperlipidemia    Personal history of colonic polyp - sessile serrated polyp 11/20/2013    Objective BP 128/74   Pulse 84   Temp 98 F (36.7 C) (Oral)   Resp 16   Ht 5\' 9"  (1.753 m)   Wt 173 lb (78.5 kg)   LMP 06/26/2010   SpO2 96%   BMI 25.55 kg/m  General: Awake, alert, appears stated age HEENT: AT, [place], ears patent b/l and TM's neg, nares patent w/o discharge, pharynx pink and without exudates, slight erythema over the tonsillar pillars bilaterally, MMM, mild sinus TTP over the left maxillary sinus Neck: No masses or asymmetry Heart: RRR, no LE edema Lungs: CTAB, no accessory muscle use Psych: Age appropriate judgment and insight, normal mood and affect  Acute maxillary sinusitis, recurrence not specified - Plan: predniSONE (DELTASONE) 20 MG tablet  Chronic cough - Plan: Ambulatory referral to Pulmonology, predniSONE (DELTASONE) 20 MG  tablet, DG Chest 2 View  5-day prednisone burst 40 mg daily.  This should help open up her sinuses and also chest if it is a little tight from her asthma.  Continue to push fluids, practice good hand hygiene, cover mouth when coughing. F/u prn. If starting to experience fevers, shaking, or shortness of breath, seek immediate care. Chronic, unstable.  We will see how the prednisone does.  She will send me an update early next week.  Check a chest x-ray.  Refer to the pulmonology team.  If the prednisone does quite well for this, I will escalate therapy for asthma.   She will follow-up as originally scheduled with Dr Drue Novel otherwise.  Pt voiced understanding and agreement to the plan.  Jilda Roche [place], DO 10/21/23 4:50 PM

## 2023-10-24 ENCOUNTER — Ambulatory Visit
Admission: RE | Admit: 2023-10-24 | Discharge: 2023-10-24 | Disposition: A | Discharge status: Home / Self Care | Payer: Medicare Other | Source: Ambulatory Visit | Attending: Family Medicine

## 2023-10-24 DIAGNOSIS — R053 Chronic cough: Secondary | ICD-10-CM

## 2023-10-24 DIAGNOSIS — I700 Atherosclerosis of aorta: Secondary | ICD-10-CM | POA: Diagnosis not present

## 2023-10-27 ENCOUNTER — Telehealth: Payer: Self-pay

## 2023-10-27 NOTE — Telephone Encounter (Signed)
Results pending.

## 2023-10-27 NOTE — Telephone Encounter (Signed)
Copied from CRM [id number]. Topic: Clinical - Lab/Test Results >> Oct 27, 2023 10:29 AM Kathryne Eriksson wrote: Reason for CRM: X-Ray Results >> Oct 27, 2023 10:31 AM Kathryne Eriksson wrote: Patient states she had ana x-ray completely recently and is unable to see the results because she's unable to access mychart. Patient is requesting to have the results sent over to her as well as requesting a call back at [phone]

## 2023-10-28 ENCOUNTER — Ambulatory Visit: Payer: Medicare Other | Admitting: Family

## 2023-10-31 ENCOUNTER — Encounter: Payer: Self-pay | Admitting: Family

## 2023-11-01 NOTE — Telephone Encounter (Signed)
Called pt advised CXR normal.

## 2023-11-04 ENCOUNTER — Other Ambulatory Visit: Payer: Self-pay | Admitting: Medical Genetics

## 2023-11-04 ENCOUNTER — Other Ambulatory Visit (HOSPITAL_COMMUNITY): Payer: Self-pay | Admitting: Cardiology

## 2023-11-04 DIAGNOSIS — Z8249 Family history of ischemic heart disease and other diseases of the circulatory system: Secondary | ICD-10-CM

## 2023-11-04 LAB — LIPID PANEL
Cholesterol: 105
HDL: 69
Triglycerides: 50 (ref 40–160)

## 2023-11-04 LAB — HEMOGLOBIN A1C: Hemoglobin A1C: 6

## 2023-11-04 LAB — POCT ABI - SCREENING FOR PILOT NO CHARGE
Left ABI: 1.07
Right ABI: 1.15

## 2023-11-04 NOTE — Progress Notes (Signed)
 Pt received ABI, BP, A1C, and lipid screenings. No SDOH needs at this time.

## 2023-11-11 ENCOUNTER — Ambulatory Visit (HOSPITAL_COMMUNITY)
Admission: RE | Admit: 2023-11-11 | Discharge: 2023-11-11 | Disposition: A | Discharge status: Home / Self Care | Payer: Self-pay | Source: Ambulatory Visit | Attending: Cardiology | Admitting: Cardiology

## 2023-11-11 DIAGNOSIS — Z8249 Family history of ischemic heart disease and other diseases of the circulatory system: Secondary | ICD-10-CM | POA: Insufficient documentation

## 2023-11-13 ENCOUNTER — Encounter: Payer: Self-pay | Admitting: Cardiology

## 2023-11-24 ENCOUNTER — Telehealth: Payer: Self-pay | Admitting: Internal Medicine

## 2023-11-24 NOTE — Telephone Encounter (Signed)
 Copied from CRM [id number]. Topic: Medical Record Request - Other >> Nov 24, 2023  9:02 AM Almira Coaster wrote: Reason for CRM: [employer] is calling to confirm that a medical release request was received for the patient. The medical release was faxed yesterday 11/23/2023. Their best call back number to confirm is [phone] and case ID is [id number]

## 2023-11-28 ENCOUNTER — Telehealth: Payer: Self-pay | Admitting: Internal Medicine

## 2023-11-28 NOTE — Telephone Encounter (Signed)
 Would've been sent to medical records dept

## 2023-11-28 NOTE — Telephone Encounter (Signed)
 Please send a letter:  To whom it may concern Courtney Larsen is a patient of mine. To my knowledge she does not have any heart condition. In fact, she had a coronary calcium score done 11/11/2023 and it was negative (zero). If you have questions or concerns please do not hesitate to reach out to my office.

## 2023-11-28 NOTE — Telephone Encounter (Signed)
 Copied from CRM [id number]. Topic: General - Other >> Nov 28, 2023 12:58 PM Gurney Maxin H wrote: Reason for CRM: Patient called stating that insurance is stating that she had some heart problems patient states she doesn't and insurance states they need a letter stating that she does not have any heart conditions. Patient would like a copy of the letter sent to her and the insurance company through email if possible, patient states you can mail her a copy, but insurance company needs it emailed. If not patient states once office sends her a copy she can forward it to [employer].  Varonica [email or web address] [phone]  Joann Hall-Johnson [email or web address]

## 2023-11-29 NOTE — Telephone Encounter (Signed)
Letter printed, awaiting PCP signature.

## 2023-11-29 NOTE — Telephone Encounter (Signed)
 Signed letter emailed to Pt as requested. Spoke w/ Pt- informed email has been sent.

## 2023-12-14 ENCOUNTER — Institutional Professional Consult (permissible substitution): Payer: Medicare Other | Admitting: Pulmonary Disease

## 2023-12-14 NOTE — Progress Notes (Unsigned)
 Pt attended 11/04/23 screening event with BP of 129/71 & A1C of 6.0. Pt noted at event that she does have a PCP. At event pt did not indicate any SDOH needs. Pt also noted that she is not a smoker.   Per chart review pt does have a PCP (Courtney Larsen; [facility]), insurance, and is not a smoker. Pt's last appt with PCP was 01/24/23 and has an upcoming appt with PCP on 01/25/24. Pt does not indicate any SDOH needs at this time.  No additional pt f/u to be scheduled at this time per health equity protocol.

## 2023-12-23 ENCOUNTER — Institutional Professional Consult (permissible substitution): Payer: Medicare Other | Admitting: Pulmonary Disease

## 2023-12-23 ENCOUNTER — Encounter: Payer: Self-pay | Admitting: Pulmonary Disease

## 2023-12-23 ENCOUNTER — Ambulatory Visit (INDEPENDENT_AMBULATORY_CARE_PROVIDER_SITE_OTHER): Payer: Medicare Other | Admitting: Pulmonary Disease

## 2023-12-23 VITALS — BP 119/72 | HR 74 | Ht 69.0 in | Wt 183.0 lb

## 2023-12-23 DIAGNOSIS — R0982 Postnasal drip: Secondary | ICD-10-CM

## 2023-12-23 DIAGNOSIS — R053 Chronic cough: Secondary | ICD-10-CM | POA: Diagnosis not present

## 2023-12-23 DIAGNOSIS — K219 Gastro-esophageal reflux disease without esophagitis: Secondary | ICD-10-CM

## 2023-12-23 MED ORDER — FLUTICASONE PROPIONATE 50 MCG/ACT NA SUSP: 1.0000 | Freq: Every day | NASAL | 2 refills | Status: DC

## 2023-12-23 MED ORDER — FAMOTIDINE 20 MG PO TABS: 20.0000 mg | ORAL_TABLET | Freq: Every day | ORAL | 5 refills | Status: AC

## 2023-12-23 NOTE — Patient Instructions (Addendum)
 I think your cough and mucous production may be coming from your allergies and reflux.  Start flonase nasal spray, 1 spray per nostril daily  Start famotidine 20mg  tablet daily, 30 minutes before breakfast  We will consider taking an allergy pill in the future if symptoms continue.  Follow up in 3 months in person or via video visit

## 2023-12-23 NOTE — Progress Notes (Signed)
 Synopsis: Referred in [place] 2025 for cough  Subjective:   PATIENT ID: Courtney Larsen GENDER: female DOB: [birth year], MRN: [id number]   HPI  Chief Complaint  Patient presents with   Consult    Pt states bring up a lot of mucus, had pinkish spots     Courtney Larsen is a 63 year old woman, never smoker  who is referred to pulmonary clinic for cough.   She has experienced a chronic cough with mucus production for several years. The mucus is sometimes tinged with blood, appearing pink or reddish, and she experiences a burning sensation in her body during these episodes. The use of a nasal spray has reduced the frequency of these episodes. No wheezing or shortness of breath is noted, but the persistent cough and thick mucus make it difficult to breathe and sleep.  She experiences heartburn or reflux, particularly at night, which causes a burning sensation in her throat. This occurs a couple of times a week and may contribute to her chronic cough and mucus production.  She recalls the onset of her symptoms coinciding with exposure to asbestos at her workplace, which led to frequent hospital visits due to chest tightness. She was also exposed to secondhand smoke from a landlord who smoked cigars. She denies any water damage in her current home, where she has lived for four years.  She has been prescribed prednisone in the past, which significantly alleviated her symptoms, including pain on her left side. She uses an albuterol inhaler, though not frequently, as it does not seem to help with her cough. She has tried local honey to manage potential pollen-related issues.  She has undergone a chest x-ray and a CT scan of her heart earlier this year, both of which were unremarkable. She has a history of allergies, confirmed by an allergy panel conducted last year, which revealed multiple allergens.  She experiences swelling in her ankles upon waking, which resolves as the day progresses.  Past  Medical History:  Diagnosis Date   Allergy    SEASONAL   Anemia    Asthma    allery induced - inhaler rarely used   Cyst of breast 06/26/2012   left - resolved on f/u per pt   Diverticulosis    Family history of bleeding or clotting disorder    ?Von Willebrand Disease: Patient, sister and mother    Helicobacter pylori gastritis 02/05/2014   EGD 01/2014   Hyperlipidemia    Personal history of colonic polyp - sessile serrated polyp 11/20/2013     Family History  Adopted: Yes  Problem Relation Age of Onset   Diabetes Mother    Ulcers Mother    Bleeding Disorder Mother    Early death Mother    Learning disabilities Mother    Heart attack Mother        late in life   Cancer Sister 60       BRAIN TUMOR   Breast cancer Sister 53   Heart disease Sister    Bleeding Disorder Sister    Cystic fibrosis Son    Asthma Son    Food Allergy Son        shellfish   Cystic fibrosis Niece    Colon cancer Neg Hx    Esophageal cancer Neg Hx    Pancreatic cancer Neg Hx    Rectal cancer Neg Hx    Stomach cancer Neg Hx    Allergic rhinitis Neg Hx    Immunodeficiency Neg Hx  Urticaria Neg Hx      Social History   Socioeconomic History   Marital status: Single    Spouse name: Not on file   Number of children: 3   Years of education: 14   Highest education level: Not on file  Occupational History   Occupation: Retired, Public relations account executive   Tobacco Use   Smoking status: Never    Passive exposure: Never   Smokeless tobacco: Never  Vaping Use   Vaping status: Never Used  Substance and Sexual Activity   Alcohol use: No    Alcohol/week: 0.0 standard drinks of alcohol   Drug use: No   Sexual activity: Yes    Partners: Male    Birth control/protection: Post-menopausal    Comment: 1st intercourse- 14, partners- 4, current partner- 15 yrs  Other Topics Concern   Not on file  Social History Narrative   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    Move from [place] to [facility] to [place] in  2013   Lives with fiancee   Children: 3 ([place], [place], [employer])       Social Drivers of Corporate investment banker Strain: Low Risk  (11/26/2021)   Overall Financial Resource Strain (CARDIA)    Difficulty of Paying Living Expenses: Not hard at all  Food Insecurity: No Food Insecurity (11/04/2023)   Hunger Vital Sign    Worried About Running Out of Food in the Last Year: Never true    Ran Out of Food in the Last Year: Never true  Transportation Needs: No Transportation Needs (11/04/2023)   PRAPARE - Administrator, Civil Service (Medical): No    Lack of Transportation (Non-Medical): No  Physical Activity: Inactive (11/26/2021)   Exercise Vital Sign    Days of Exercise per Week: 0 days    Minutes of Exercise per Session: 0 min  Stress: No Stress Concern Present (11/26/2021)   [employer] of Occupational Health - Occupational Stress Questionnaire    Feeling of Stress : Not at all  Social Connections: Socially Isolated (11/26/2021)   Social Connection and Isolation Panel [NHANES]    Frequency of Communication with Friends and Family: Once a week    Frequency of Social Gatherings with Friends and Family: Once a week    Attends Religious Services: Never    Database administrator or Organizations: No    Attends Banker Meetings: Never    Marital Status: Never married  Intimate Partner Violence: Not At Risk (11/04/2023)   Humiliation, Afraid, Rape, and Kick questionnaire    Fear of Current or Ex-Partner: No    Emotionally Abused: No    Physically Abused: No    Sexually Abused: No     No Known Allergies   Outpatient Medications Prior to Visit  Medication Sig Dispense Refill   albuterol (VENTOLIN HFA) 108 (90 Base) MCG/ACT inhaler Inhale 2 puffs into the lungs every 4 (four) hours as needed for wheezing or shortness of breath. 18 g 1   azelastine (ASTELIN) 0.1 % nasal spray Place 2 sprays into both nostrils 2 (two) times daily. 30 mL 5   No  facility-administered medications prior to visit.    Review of Systems  Constitutional:  Negative for chills, fever, malaise/fatigue and weight loss.  HENT:  Positive for congestion. Negative for sinus pain and sore throat.   Eyes: Negative.   Respiratory:  Positive for cough. Negative for hemoptysis, sputum production, shortness of breath and wheezing.   Cardiovascular:  Negative for chest pain, palpitations, orthopnea, claudication and leg swelling.  Gastrointestinal:  Positive for heartburn. Negative for abdominal pain, nausea and vomiting.  Genitourinary: Negative.   Musculoskeletal:  Negative for joint pain and myalgias.  Skin:  Negative for rash.  Neurological:  Negative for weakness.  Endo/Heme/Allergies:  Positive for environmental allergies.  Psychiatric/Behavioral: Negative.     Objective:   Vitals:   12/23/23 0857  BP: 119/72  Pulse: 74  SpO2: 98%  Weight: 183 lb (83 kg)  Height: 5\' 9"  (1.753 m)   Physical Exam Constitutional:      General: She is not in acute distress.    Appearance: Normal appearance.  Eyes:     General: No scleral icterus.    Conjunctiva/sclera: Conjunctivae normal.  Cardiovascular:     Rate and Rhythm: Normal rate and regular rhythm.  Pulmonary:     Breath sounds: No wheezing, rhonchi or rales.  Musculoskeletal:     Right lower leg: No edema.     Left lower leg: No edema.  Skin:    General: Skin is warm and dry.  Neurological:     General: No focal deficit present.    CBC    Component Value Date/Time   WBC 4.4 01/24/2023 0947   RBC 4.31 01/24/2023 0947   HGB 12.4 01/24/2023 0947   HCT 38.5 01/24/2023 0947   PLT 229.0 01/24/2023 0947   MCV 89.3 01/24/2023 0947   MCH 29.2 07/06/2012 1310   MCHC 32.3 01/24/2023 0947   RDW 14.8 01/24/2023 0947   LYMPHSABS 1.6 01/24/2023 0947   MONOABS 0.3 01/24/2023 0947   EOSABS 0.2 01/24/2023 0947   BASOSABS 0.0 01/24/2023 0947      Latest Ref Rng & Units 01/24/2023    9:47 AM 01/21/2022    11:45 AM 01/19/2021   11:24 AM  BMP  Glucose 70 - 99 mg/dL 83  86  87   BUN 6 - 23 mg/dL 16  16  11    Creatinine 0.40 - 1.20 mg/dL 6.21  3.08  6.57   Sodium 135 - 145 mEq/L 141  139  141   Potassium 3.5 - 5.1 mEq/L 4.3  4.2  3.9   Chloride 96 - 112 mEq/L 104  104  105   CO2 19 - 32 mEq/L 30  31  30    Calcium 8.4 - 10.5 mg/dL 9.0  9.1  9.0    Chest imaging: CT Cardiac Scoring 11/11/23 Mediastinum/Nodes: No enlarged mediastinal lymph nodes. The visible trachea and esophagus demonstrate no significant findings.   Lungs/Pleura: The visible lungs are clear. No pleural effusion  CXR 10/24/23 The heart size and mediastinal contours are within normal limits. Aortic atherosclerosis. Both lungs are clear. The visualized skeletal structures are unremarkable. Hardware in the cervical spine  PFT:    Latest Ref Rng & Units 03/22/2014    2:34 PM  PFT Results  FVC-Pre L 2.33   FVC-Predicted Pre % 70   FVC-Post L 2.50   FVC-Predicted Post % 75   Pre FEV1/FVC % % 65   Post FEV1/FCV % % 72   FEV1-Pre L 1.52   FEV1-Predicted Pre % 57   FEV1-Post L 1.80   DLCO uncorrected ml/min/mmHg 21.36   DLCO UNC% % 70   DLVA Predicted % 128   TLC L 5.73   TLC % Predicted % 99   RV % Predicted % 162     Labs: 01/24/23: abdsolute eos 200, Allergy panel IgE 646  Path:  Echo:  Heart Catheterization:     Assessment & Plan:   Chronic cough  Post-nasal drainage - Plan: fluticasone (FLONASE) 50 MCG/ACT nasal spray  Gastroesophageal reflux disease without esophagitis - Plan: famotidine (PEPCID) 20 MG tablet  Discussion: Courtney Larsen is a 63 year old woman, never smoker who is referred to pulmonary clinic for cough.   Chronic cough with mucus production Chronic cough with mucus and occasional blood tinged sputum, likely due to post-nasal drip, reflux, or allergens. Improvement with nasal spray and prednisone suggests allergic or inflammatory etiology. - Prescribe Flonase nasal spray, one  spray per nostril daily. - Initiate daily reflux medication. - Follow up in three months to assess symptom improvement.  Allergic rhinitis Allergy testing identified multiple allergens.Nasal spray has reduced symptoms in past. - Continue Flonase nasal spray, one spray per nostril daily. - Consider adding daily antihistamine like Zyrtec or Allegra if symptoms persist.  Gastroesophageal reflux disease (GERD) Intermittent heartburn and reflux, particularly nocturnal, may contribute to chronic cough and mucus production. - Start famotidine 20mg  daily - Reassess symptoms in three months.  Courtney Comas, MD [facility] Office: [phone]    Current Outpatient Medications:    albuterol (VENTOLIN HFA) 108 (90 Base) MCG/ACT inhaler, Inhale 2 puffs into the lungs every 4 (four) hours as needed for wheezing or shortness of breath., Disp: 18 g, Rfl: 1   azelastine (ASTELIN) 0.1 % nasal spray, Place 2 sprays into both nostrils 2 (two) times daily., Disp: 30 mL, Rfl: 5   famotidine (PEPCID) 20 MG tablet, Take 1 tablet (20 mg total) by mouth daily., Disp: 30 tablet, Rfl: 5   fluticasone (FLONASE) 50 MCG/ACT nasal spray, Place 1 spray into both nostrils daily., Disp: 16 g, Rfl: 2

## 2023-12-30 ENCOUNTER — Telehealth: Payer: Self-pay | Admitting: Emergency Medicine

## 2023-12-30 NOTE — Telephone Encounter (Signed)
 [employer] [facility] called about medical records. I called back and gave them MR's # and fax. NFN

## 2024-01-19 ENCOUNTER — Encounter: Payer: Self-pay | Admitting: Internal Medicine

## 2024-01-19 DIAGNOSIS — H524 Presbyopia: Secondary | ICD-10-CM | POA: Diagnosis not present

## 2024-01-25 ENCOUNTER — Encounter: Payer: Medicare Other | Admitting: Internal Medicine

## 2024-02-07 ENCOUNTER — Ambulatory Visit (INDEPENDENT_AMBULATORY_CARE_PROVIDER_SITE_OTHER)

## 2024-02-07 VITALS — Ht 69.0 in | Wt 183.0 lb

## 2024-02-07 DIAGNOSIS — Z0000 Encounter for general adult medical examination without abnormal findings: Secondary | ICD-10-CM | POA: Diagnosis not present

## 2024-02-07 NOTE — Progress Notes (Signed)
 Subjective:   Courtney Larsen is a 63 y.o. who presents for a Medicare Wellness preventive visit.  As a reminder, Annual Wellness Visits don't include a physical exam, and some assessments may be limited, especially if this visit is performed virtually. We may recommend an in-person visit if needed.  Visit Complete: Virtual I connected with  Courtney Larsen on 02/07/24 by a audio enabled telemedicine application and verified that I am speaking with the correct person using two identifiers.  Patient Location: Home  Provider Location: Home Office  I discussed the limitations of evaluation and management by telemedicine. The patient expressed understanding and agreed to proceed.  Vital Signs: Because this visit was a virtual/telehealth visit, some criteria may be missing or patient reported. Any vitals not documented were not able to be obtained and vitals that have been documented are patient reported.    Persons Participating in Visit: Patient.  AWV Questionnaire: No: Patient Medicare AWV questionnaire was not completed prior to this visit.  Cardiac Risk Factors include: advanced age (>6men, >47 women)     Objective:     Today's Vitals   02/07/24 0848  Weight: 183 lb (83 kg)  Height: 5\' 9"  (1.753 m)   Body mass index is 27.02 kg/m.     02/07/2024    8:54 AM 01/27/2023    9:44 AM 11/26/2021    7:42 AM 09/25/2019    3:02 PM 01/13/2017   10:56 AM  Advanced Directives  Does Patient Have a Medical Advance Directive? Yes Yes No Yes No  Type of Estate agent of [place];Living will Healthcare Power of [place];Living will  Healthcare Power of [place];Living will   Does patient want to make changes to medical advance directive?  No - Patient declined  No - Patient declined   Copy of Healthcare Power of Attorney in Chart? No - copy requested No - copy requested  Yes - validated most recent copy scanned in chart (See row information)   Would patient like  information on creating a medical advance directive?   Yes (MAU/Ambulatory/Procedural Areas - Information given)      Current Medications (verified) Outpatient Encounter Medications as of 02/07/2024  Medication Sig   albuterol  (VENTOLIN  HFA) 108 (90 Base) MCG/ACT inhaler Inhale 2 puffs into the lungs every 4 (four) hours as needed for wheezing or shortness of breath.   azelastine  (ASTELIN ) 0.1 % nasal spray Place 2 sprays into both nostrils 2 (two) times daily.   famotidine  (PEPCID ) 20 MG tablet Take 1 tablet (20 mg total) by mouth daily.   fluticasone  (FLONASE ) 50 MCG/ACT nasal spray Place 1 spray into both nostrils daily.   No facility-administered encounter medications on file as of 02/07/2024.    Allergies (verified) Patient has no known allergies.   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   Cyst of breast 06/26/2012   left - resolved on f/u per pt   Diverticulosis    Family history of bleeding or clotting disorder    ?Von Willebrand Disease: Patient, sister and mother    Helicobacter pylori gastritis 02/05/2014   EGD 01/2014   Hyperlipidemia    Personal history of colonic polyp - sessile serrated polyp 11/20/2013   Past Surgical History:  Procedure Laterality Date   ANTERIOR CERVICAL DECOMP/DISCECTOMY FUSION  07/06/2012   Procedure: ANTERIOR CERVICAL DECOMPRESSION/DISCECTOMY FUSION 1 LEVEL;  Surgeon: Jenine Mix  Carolan Chuck, MD;  Location: [facility];  Service: Neurosurgery;  Laterality: N/A;  Cervical six-seven anterior cervical decompression with fusion plating and bonegraft   BACK SURGERY  2011   LOW BACK   CESAREAN SECTION  1988   COLONOSCOPY  01/27/2017   with snare polypectomy   WISDOM TOOTH EXTRACTION     Family History  Adopted: Yes  Problem Relation Age of Onset   Diabetes Mother    Ulcers Mother    Bleeding Disorder Mother    Early death Mother    Learning disabilities  Mother    Heart attack Mother        late in life   Cancer Sister 9       BRAIN TUMOR   Breast cancer Sister 85   Heart disease Sister    Bleeding Disorder Sister    Cystic fibrosis Son    Asthma Son    Food Allergy Son        shellfish   Cystic fibrosis Niece    Colon cancer Neg Hx    Esophageal cancer Neg Hx    Pancreatic cancer Neg Hx    Rectal cancer Neg Hx    Stomach cancer Neg Hx    Allergic rhinitis Neg Hx    Immunodeficiency Neg Hx    Urticaria Neg Hx    Social History   Socioeconomic History   Marital status: Single    Spouse name: Not on file   Number of children: 3   Years of education: 14   Highest education level: Not on file  Occupational History   Occupation: Retired, Public relations account executive   Tobacco Use   Smoking status: Never    Passive exposure: Never   Smokeless tobacco: Never  Vaping Use   Vaping status: Never Used  Substance and Sexual Activity   Alcohol use: No    Alcohol/week: 0.0 standard drinks of alcohol   Drug use: No   Sexual activity: Yes    Partners: Male    Birth control/protection: Post-menopausal    Comment: 1st intercourse- 14, partners- 4, current partner- 15 yrs  Other Topics Concern   Not on file  Social History Narrative   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    Move from [place] to [facility] to [place] in 2013   Lives with fiancee   Children: 3 ([place] , [place], [employer])       Social Drivers of Corporate investment banker Strain: Low Risk  (02/07/2024)   Overall Financial Resource Strain (CARDIA)    Difficulty of Paying Living Expenses: Not hard at all  Food Insecurity: No Food Insecurity (02/07/2024)   Hunger Vital Sign    Worried About Running Out of Food in the Last Year: Never true    Ran Out of Food in the Last Year: Never true  Transportation Needs: No Transportation Needs (02/07/2024)   PRAPARE - Administrator, Civil Service (Medical): No    Lack of Transportation (Non-Medical): No  Physical Activity:  Inactive (02/07/2024)   Exercise Vital Sign    Days of Exercise per Week: 0 days    Minutes of Exercise per Session: 0 min  Stress: No Stress Concern Present (02/07/2024)   [employer] of Occupational Health - Occupational Stress Questionnaire    Feeling of Stress : Not at all  Social Connections: Moderately Integrated (02/07/2024)   Social Connection and Isolation Panel [NHANES]    Frequency of Communication with Friends and Family: More than three  times a week    Frequency of Social Gatherings with Friends and Family: More than three times a week    Attends Religious Services: More than 4 times per year    Active Member of [employer] or Organizations: Yes    Attends Engineer, structural: More than 4 times per year    Marital Status: Never married    Tobacco Counseling Counseling given: Not Answered    Clinical Intake:  Pre-visit preparation completed: Yes  Pain : No/denies pain     BMI - recorded: 27.02 Nutritional Status: BMI 25 -29 Overweight Nutritional Risks: None Diabetes: No  Lab Results  Component Value Date   HGBA1C 6 11/04/2023     How often do you need to have someone help you when you read instructions, pamphlets, or other written materials from your doctor or pharmacy?: 1 - Never  Interpreter Needed?: No  Information entered by :: Farris Hong LPN   Activities of Daily Living      02/07/2024    8:53 AM  In your present state of health, do you have any difficulty performing the following activities:  Hearing? 0  Vision? 0  Difficulty concentrating or making decisions? 0  Walking or climbing stairs? 0  Dressing or bathing? 0  Doing errands, shopping? 0  Preparing Food and eating ? N  Using the Toilet? N  In the past six months, have you accidently leaked urine? N  Do you have problems with loss of bowel control? N  Managing your Medications? N  Managing your Finances? N  Housekeeping or managing your Housekeeping? N    Patient  Care Team: Ezell Hollow, MD as PCP - General (Internal Medicine) Audie Bleacher, MD (Neurosurgery) Gretchen Leavell, MD as Consulting Physician (Obstetrics and Gynecology)  Indicate any recent Medical Services you may have received from other than [facility] providers in the past year (date may be approximate).     Assessment:    This is a routine wellness examination for Minh.  Hearing/Vision screen Hearing Screening - Comments:: Denies hearing difficulties   Vision Screening - Comments:: Wears rx glasses - up to date with routine eye exams with  [facility]   Goals Addressed               This Visit's Progress     Increase physical activity (pt-stated)        Lose weight.       Depression Screen      02/07/2024    8:52 AM 01/24/2023    8:58 AM 01/21/2022   10:55 AM 11/26/2021    7:48 AM 01/19/2021   10:51 AM 09/25/2019    3:06 PM 12/18/2018    9:58 AM  PHQ 2/9 Scores  PHQ - 2 Score 0 0 0 0 0 0 0  PHQ- 9 Score       3    Fall Risk      02/07/2024    8:53 AM 12/23/2023    8:56 AM 01/24/2023    8:58 AM 01/21/2022   10:55 AM 11/26/2021    7:47 AM  Fall Risk   Falls in the past year? 0 0 0 0 0  Number falls in past yr: 0  0 0 0  Injury with Fall? 0  0 0 0  Risk for fall due to : No Fall Risks      Follow up Falls prevention discussed;Falls evaluation completed  Falls evaluation completed Falls evaluation completed Falls prevention  discussed    MEDICARE RISK AT HOME:   Medicare Risk at Home Any stairs in or around the home?: Yes If so, are there any without handrails?: No Home free of loose throw rugs in walkways, pet beds, electrical cords, etc?: Yes Adequate lighting in your home to reduce risk of falls?: Yes Life alert?: No Use of a cane, walker or w/c?: No Grab bars in the bathroom?: Yes Shower chair or bench in shower?: No Elevated toilet seat or a handicapped toilet?: No  TIMED UP AND GO:  Was the test performed?  No  Cognitive Function: 6CIT  completed        02/07/2024    8:54 AM 01/27/2023    9:53 AM  6CIT Screen  What Year? 0 points 0 points  What month? 0 points 0 points  What time? 0 points 0 points  Count back from 20 0 points 0 points  Months in reverse 0 points 0 points  Repeat phrase 0 points 2 points  Total Score 0 points 2 points    Immunizations Immunization History  Administered Date(s) Administered   Influenza,inj,Quad PF,6+ Mos 06/27/2017, 07/05/2019   Moderna Covid-19 Vaccine  Bivalent Booster 40yrs & up 07/24/2021   Moderna SARS-COV2 Booster Vaccination 03/03/2021   Moderna Sars-Covid-2 Vaccination 10/26/2019, 11/23/2019, 09/17/2020   Pneumococcal Polysaccharide-23 07/07/2012   RSV,unspecified 08/07/2022   Tdap 06/26/2012   Zoster Recombinant(Shingrix) 03/05/2022, 05/04/2022    Screening Tests Health Maintenance  Topic Date Due   Pneumococcal Vaccine 67-52 Years old (2 of 2 - PCV) 07/07/2013   DTaP/Tdap/Td (2 - Td or Tdap) 06/26/2022   COVID-19 Vaccine (5 - 2024-25 season) 05/29/2023   MAMMOGRAM  02/27/2024   INFLUENZA VACCINE  04/27/2024   Medicare Annual Wellness (AWV)  02/06/2025   Cervical Cancer Screening (HPV/Pap Cotest)  03/13/2027   Colonoscopy  07/16/2029   Hepatitis C Screening  Completed   HIV Screening  Completed   Zoster Vaccines- Shingrix  Completed   HPV VACCINES  Aged Out   Meningococcal B Vaccine  Aged Out    Health Maintenance  Health Maintenance Due  Topic Date Due   Pneumococcal Vaccine 35-29 Years old (2 of 2 - PCV) 07/07/2013   DTaP/Tdap/Td (2 - Td or Tdap) 06/26/2022   COVID-19 Vaccine (5 - 2024-25 season) 05/29/2023   Health Maintenance Items Addressed: Vaccines deferred  Additional Screening:  Vision Screening: Recommended annual ophthalmology exams for early detection of glaucoma and other disorders of the eye.  Dental Screening: Recommended annual dental exams for proper oral hygiene  Community Resource Referral / Chronic Care Management: CRR  required this visit?  No   CCM required this visit?   No  Plan:    I have personally reviewed and noted the following in the patient's chart:   Medical and social history Use of alcohol, tobacco or illicit drugs  Current medications and supplements including opioid prescriptions.  Functional ability and status Nutritional status Physical activity Advanced directives List of other physicians Hospitalizations, surgeries, and ER visits in previous 12 months Vitals Screenings to include cognitive, depression, and falls Referrals and appointments  In addition, I have reviewed and discussed with patient certain preventive protocols, quality metrics, and best practice recommendations. A written personalized care plan for preventive services as well as general preventive health recommendations were provided to patient.   Dewayne Ford, LPN   1/61/0960   After Visit Summary: (MyChart) Due to this being a telephonic visit, the after visit summary with patients personalized plan  was offered to patient via MyChart   Notes: Nothing significant to report at this time.

## 2024-02-07 NOTE — Patient Instructions (Addendum)
 Ms. Pilarczyk , Thank you for taking time out of your busy schedule to complete your Annual Wellness Visit with me. I enjoyed our conversation and look forward to speaking with you again next year. I, as well as your care team,  appreciate your ongoing commitment to your health goals. Please review the following plan we discussed and let me know if I can assist you in the future. Your Game plan/ To Do List    Referrals: If you haven't heard from the office you've been referred to, please reach out to them at the phone provided.   Follow up Visits: Next Medicare AWV with our clinical staff: 02/14/25   Have you seen your provider in the last 6 months (3 months if uncontrolled diabetes)? Yes 01/25/24 Next Office Visit with your provider: Deferred  Clinician Recommendations:  Aim for 30 minutes of exercise or brisk walking, 6-8 glasses of water, and 5 servings of fruits and vegetables each day.       This is a list of the screening recommended for you and due dates:  Health Maintenance  Topic Date Due   Pneumococcal Vaccination (2 of 2 - PCV) 07/07/2013   DTaP/Tdap/Td vaccine (2 - Td or Tdap) 06/26/2022   COVID-19 Vaccine (5 - 2024-25 season) 05/29/2023   Mammogram  02/27/2024   Flu Shot  04/27/2024   Medicare Annual Wellness Visit  02/06/2025   Pap with HPV screening  03/13/2027   Colon Cancer Screening  07/16/2029   Hepatitis C Screening  Completed   HIV Screening  Completed   Zoster (Shingles) Vaccine  Completed   HPV Vaccine  Aged Out   Meningitis B Vaccine  Aged Out    Advanced directives: (Copy Requested) Please bring a copy of your health care power of attorney and living will to the office to be added to your chart at your convenience. You can mail to [facility] [address] [place], [place] [address] or email to [email or web address] Advance Care Planning is important because it:  [x]  Makes sure you receive the medical care that is consistent with your  values, goals, and preferences  [x]  It provides guidance to your family and loved ones and reduces their decisional burden about whether or not they are making the right decisions based on your wishes.  Follow the link provided in your after visit summary or read over the paperwork we have mailed to you to help you started getting your Advance Directives in place. If you need assistance in completing these, please reach out to us  so that we can help you!  See attachments for Preventive Care and Fall Prevention Tips.

## 2024-04-30 NOTE — Progress Notes (Addendum)
 [facility] Quality Team Note  Name: Courtney Larsen Date of Birth: [birth year] MRN: [id number] Date: 04/30/2024  [facility] Quality Team has reviewed this patient's chart, please see recommendations below:  [facility] Quality Other; (CHART REVIEWED FOR [employer]. LAST MAMMOGRAM 02/2022 NOT COMPLIANT.)  09/12/2024- Chart reviewed again for [employer]

## 2024-05-21 ENCOUNTER — Telehealth: Payer: Self-pay | Admitting: Internal Medicine

## 2024-05-21 NOTE — Telephone Encounter (Signed)
 Pt overdue for physical. Please schedule at her convenience. We will complete labs at that time.

## 2024-05-21 NOTE — Telephone Encounter (Unsigned)
 Copied from CRM [id number]. Topic: Clinical - Request for Lab/Test Order >> May 21, 2024  2:25 PM Gennette ORN wrote: Reason for CRM: Patient is requesting blood work done she want a check up. Her call back number is [phone]. Please call patient back and update when order is ready so she can schedule appointment.

## 2024-07-02 ENCOUNTER — Telehealth (HOSPITAL_BASED_OUTPATIENT_CLINIC_OR_DEPARTMENT_OTHER): Payer: Self-pay | Admitting: *Deleted

## 2024-07-02 NOTE — Telephone Encounter (Signed)
 Copied from CRM [id number]. Topic: Appointments - Transfer of Care >> Jul 02, 2024 12:02 PM Wess RAMAN wrote: Pt is requesting to transfer FROM: Amon Schanz, MD Pt is requesting to transfer TO:  [place], Raymond, MD Reason for requested transfer: Closer to home location It is the responsibility of the team the patient would like to transfer to Mare [place], Quintin, MD) to reach out to the patient if for any reason this transfer is not acceptable.

## 2024-07-02 NOTE — Telephone Encounter (Signed)
 Appt made.

## 2024-07-19 ENCOUNTER — Other Ambulatory Visit: Payer: Self-pay | Admitting: Medical Genetics

## 2024-07-19 DIAGNOSIS — Z006 Encounter for examination for normal comparison and control in clinical research program: Secondary | ICD-10-CM

## 2024-07-25 ENCOUNTER — Other Ambulatory Visit: Payer: Self-pay | Admitting: Pulmonary Disease

## 2024-07-25 DIAGNOSIS — R0982 Postnasal drip: Secondary | ICD-10-CM

## 2024-10-02 ENCOUNTER — Encounter (HOSPITAL_BASED_OUTPATIENT_CLINIC_OR_DEPARTMENT_OTHER)

## 2025-02-14 ENCOUNTER — Ambulatory Visit
